# Patient Record
Sex: Male | Born: 1935 | Race: Black or African American | Hispanic: No | Marital: Married | Smoking: Former smoker
Health system: Southern US, Community
[De-identification: ages and names within clinical notes are randomized; demographics above are authoritative.]

## PROBLEM LIST (undated history)

## (undated) DIAGNOSIS — E039 Hypothyroidism, unspecified: Secondary | ICD-10-CM

## (undated) DIAGNOSIS — N183 Chronic kidney disease, stage 3 (moderate): Secondary | ICD-10-CM

## (undated) DIAGNOSIS — I69359 Hemiplegia and hemiparesis following cerebral infarction affecting unspecified side: Secondary | ICD-10-CM

## (undated) DIAGNOSIS — E785 Hyperlipidemia, unspecified: Secondary | ICD-10-CM

## (undated) DIAGNOSIS — I499 Cardiac arrhythmia, unspecified: Secondary | ICD-10-CM

## (undated) DIAGNOSIS — R001 Bradycardia, unspecified: Secondary | ICD-10-CM

## (undated) DIAGNOSIS — N200 Calculus of kidney: Secondary | ICD-10-CM

## (undated) DIAGNOSIS — I10 Essential (primary) hypertension: Secondary | ICD-10-CM

## (undated) DIAGNOSIS — M1711 Unilateral primary osteoarthritis, right knee: Secondary | ICD-10-CM

## (undated) DIAGNOSIS — I442 Atrioventricular block, complete: Secondary | ICD-10-CM

## (undated) DIAGNOSIS — Z950 Presence of cardiac pacemaker: Secondary | ICD-10-CM

## (undated) DIAGNOSIS — I453 Trifascicular block: Secondary | ICD-10-CM

## (undated) HISTORY — DX: Bradycardia, unspecified: R00.1

## (undated) HISTORY — DX: Essential (primary) hypertension: I10

## (undated) HISTORY — DX: Cardiac arrhythmia, unspecified: I49.9

## (undated) HISTORY — DX: Hyperlipidemia, unspecified: E78.5

## (undated) HISTORY — DX: Calculus of kidney: N20.0

## (undated) HISTORY — DX: Unilateral primary osteoarthritis, right knee: M17.11

## (undated) HISTORY — DX: Hemiplegia and hemiparesis following cerebral infarction affecting unspecified side: I69.359

## (undated) HISTORY — DX: Chronic kidney disease, stage 3 (moderate): N18.3

## (undated) HISTORY — DX: Trifascicular block: I45.3

## (undated) HISTORY — PX: OTHER SURGICAL HISTORY: SHX169

---
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
## 2008-03-28 ENCOUNTER — Emergency Department (HOSPITAL_COMMUNITY): Admission: EM | Admit: 2008-03-28 | Discharge: 2008-03-28 | Payer: Self-pay | Admitting: Emergency Medicine

## 2009-10-29 ENCOUNTER — Inpatient Hospital Stay (HOSPITAL_COMMUNITY): Admission: EM | Admit: 2009-10-29 | Discharge: 2009-10-31 | Payer: Self-pay | Admitting: Emergency Medicine

## 2009-10-29 ENCOUNTER — Ambulatory Visit: Payer: Self-pay | Admitting: Cardiology

## 2009-10-30 ENCOUNTER — Encounter (INDEPENDENT_AMBULATORY_CARE_PROVIDER_SITE_OTHER): Payer: Self-pay | Admitting: Internal Medicine

## 2011-01-03 LAB — CBC
HCT: 40 % (ref 39.0–52.0)
MCHC: 33.5 g/dL (ref 30.0–36.0)
RDW: 14.4 % (ref 11.5–15.5)
WBC: 6.2 10*3/uL (ref 4.0–10.5)

## 2011-01-03 LAB — CARDIAC PANEL(CRET KIN+CKTOT+MB+TROPI)
CK, MB: 1.4 ng/mL (ref 0.3–4.0)
Relative Index: INVALID (ref 0.0–2.5)
Relative Index: INVALID (ref 0.0–2.5)
Total CK: 66 U/L (ref 7–232)
Total CK: 71 U/L (ref 7–232)
Troponin I: 0.02 ng/mL (ref 0.00–0.06)
Troponin I: 0.03 ng/mL (ref 0.00–0.06)

## 2011-01-03 LAB — DIFFERENTIAL
Basophils Absolute: 0 10*3/uL (ref 0.0–0.1)
Basophils Relative: 0 % (ref 0–1)
Eosinophils Absolute: 0.1 10*3/uL (ref 0.0–0.7)
Eosinophils Relative: 3 % (ref 0–5)
Lymphs Abs: 1.1 10*3/uL (ref 0.7–4.0)
Monocytes Absolute: 0.3 10*3/uL (ref 0.1–1.0)
Monocytes Absolute: 0.4 10*3/uL (ref 0.1–1.0)
Monocytes Relative: 5 % (ref 3–12)
Monocytes Relative: 7 % (ref 3–12)
Neutrophils Relative %: 81 % — ABNORMAL HIGH (ref 43–77)

## 2011-01-03 LAB — BASIC METABOLIC PANEL
BUN: 17 mg/dL (ref 6–23)
CO2: 25 mEq/L (ref 19–32)
CO2: 25 mEq/L (ref 19–32)
CO2: 28 mEq/L (ref 19–32)
Calcium: 8.7 mg/dL (ref 8.4–10.5)
Calcium: 9.1 mg/dL (ref 8.4–10.5)
Chloride: 108 mEq/L (ref 96–112)
Chloride: 109 mEq/L (ref 96–112)
GFR calc Af Amer: 60 mL/min — ABNORMAL LOW (ref >60)
GFR calc Af Amer: >60 mL/min (ref >60)
GFR calc Af Amer: >60 mL/min (ref >60)
GFR calc non Af Amer: 49 mL/min — ABNORMAL LOW (ref >60)
Potassium: 3.8 mEq/L (ref 3.5–5.1)
Potassium: 3.8 mEq/L (ref 3.5–5.1)
Potassium: 3.9 mEq/L (ref 3.5–5.1)
Sodium: 141 mEq/L (ref 135–145)

## 2011-01-03 LAB — LIPID PANEL: VLDL: 15 mg/dL (ref 0–40)

## 2011-01-03 LAB — POCT CARDIAC MARKERS
CKMB, poc: 2 ng/mL (ref 1.0–8.0)
Troponin i, poc: <0.05 ng/mL (ref 0.00–0.09)

## 2011-01-03 LAB — TSH: TSH: 2.732 u[IU]/mL (ref 0.350–4.500)

## 2011-01-03 LAB — HEMOGLOBIN A1C
Hgb A1c MFr Bld: 5.7 % (ref 4.6–6.1)
Mean Plasma Glucose: 117 mg/dL

## 2011-07-15 LAB — DIFFERENTIAL
Basophils Absolute: 0
Monocytes Absolute: 0.3

## 2011-07-15 LAB — BASIC METABOLIC PANEL
Calcium: 8.7
Creatinine, Ser: 1.36
GFR calc Af Amer: >60
GFR calc non Af Amer: 52 — ABNORMAL LOW

## 2011-07-15 LAB — CBC
HCT: 39.8
MCHC: 35.1
Platelets: 164
RBC: 4.89

## 2012-02-25 ENCOUNTER — Encounter: Payer: Self-pay | Admitting: *Deleted

## 2014-02-28 ENCOUNTER — Other Ambulatory Visit: Payer: Self-pay | Admitting: Family Medicine

## 2014-02-28 ENCOUNTER — Other Ambulatory Visit: Payer: Self-pay

## 2014-02-28 DIAGNOSIS — R200 Anesthesia of skin: Secondary | ICD-10-CM

## 2014-03-04 ENCOUNTER — Ambulatory Visit
Admission: RE | Admit: 2014-03-04 | Discharge: 2014-03-04 | Disposition: A | Discharge status: Home / Self Care | Payer: Medicare Other | Source: Ambulatory Visit | Attending: Family Medicine | Admitting: Family Medicine

## 2014-03-04 DIAGNOSIS — R200 Anesthesia of skin: Secondary | ICD-10-CM

## 2014-04-01 ENCOUNTER — Encounter: Payer: Self-pay | Admitting: Interventional Cardiology

## 2014-04-01 ENCOUNTER — Ambulatory Visit (INDEPENDENT_AMBULATORY_CARE_PROVIDER_SITE_OTHER): Payer: Medicare Other | Admitting: Interventional Cardiology

## 2014-04-01 VITALS — BP 122/80 | HR 48 | Ht 70.0 in | Wt 177.0 lb

## 2014-04-01 DIAGNOSIS — Z01810 Encounter for preprocedural cardiovascular examination: Secondary | ICD-10-CM

## 2014-04-01 DIAGNOSIS — I69359 Hemiplegia and hemiparesis following cerebral infarction affecting unspecified side: Secondary | ICD-10-CM | POA: Insufficient documentation

## 2014-04-01 DIAGNOSIS — I69959 Hemiplegia and hemiparesis following unspecified cerebrovascular disease affecting unspecified side: Secondary | ICD-10-CM

## 2014-04-01 DIAGNOSIS — N183 Chronic kidney disease, stage 3 unspecified: Secondary | ICD-10-CM

## 2014-04-01 DIAGNOSIS — I10 Essential (primary) hypertension: Secondary | ICD-10-CM

## 2014-04-01 DIAGNOSIS — M1711 Unilateral primary osteoarthritis, right knee: Secondary | ICD-10-CM | POA: Insufficient documentation

## 2014-04-01 DIAGNOSIS — M1710 Unilateral primary osteoarthritis, unspecified knee: Secondary | ICD-10-CM

## 2014-04-01 DIAGNOSIS — E039 Hypothyroidism, unspecified: Secondary | ICD-10-CM

## 2014-04-01 DIAGNOSIS — G8190 Hemiplegia, unspecified affecting unspecified side: Secondary | ICD-10-CM | POA: Insufficient documentation

## 2014-04-01 DIAGNOSIS — IMO0002 Reserved for concepts with insufficient information to code with codable children: Secondary | ICD-10-CM

## 2014-04-01 HISTORY — DX: Hemiplegia and hemiparesis following cerebral infarction affecting unspecified side: I69.359

## 2014-04-01 HISTORY — DX: Unilateral primary osteoarthritis, right knee: M17.11

## 2014-04-01 HISTORY — DX: Chronic kidney disease, stage 3 unspecified: N18.30

## 2014-04-01 HISTORY — DX: Essential (primary) hypertension: I10

## 2014-04-01 NOTE — Progress Notes (Addendum)
   Date: 04/01/2014 ID: [name], DOB [birth year], MRN [id number] PCP: No primary provider on file.  Reason: Preoperative clearance for noncardiac surgery  ASSESSMENT;  1. Preoperative clearance for noncardiac surgery, pending results of the workup noted below 2. Trifascicular block with first-degree AV block, right bundle branch block, and left anterior hemiblock. The patient is asymptomatic but has limited exertional tolerance due to severe right knee arthritis 3. Hypertension 4. Osteoarthritis, right knee 5. Hypothyroidism  PLAN:  1. 24 hour Holter monitor to rule out excessive bradycardia do to high-grade AV block 2. 2-D Doppler echocardiogram to assess LV function 3. Clearance for surgery is pending results of testing   SUBJECTIVE: Steven Krueger is a 79 y.o. male who is asymptomatic from a cardiac standpoint. He is referred for cardiac clearance for noncardiac surgery because of an abnormal EKG. He denies chest pain, dyspnea, orthopnea, syncope, palpitations, edema, claudication, and lower extremity swelling.   Allergies not on file  Current Outpatient Prescriptions on File Prior to Visit  Medication Sig Dispense Refill  . aspirin 325 MG tablet Take 325 mg by mouth daily.      Marland Kitchen levothyroxine (SYNTHROID, LEVOTHROID) 25 MCG tablet Take 25 mcg by mouth daily.       No current facility-administered medications on file prior to visit.    Past Medical History  Diagnosis Date  . Hypertension   . Hyperlipidemia   . Thyroid disease   . Kidney stones   . H/O: CVA (cardiovascular accident)     Past Surgical History  Procedure Laterality Date  . Knee surgery      History   Social History  . Marital Status: Married    Spouse Name: N/A    Number of Children: 2  . Years of Education: N/A   Occupational History  . retired    Social History Main Topics  . Smoking status: Former Games developer  . Smokeless tobacco: Not on file  . Alcohol Use: No  . Drug Use: Not on file    . Sexual Activity: Not on file   Other Topics Concern  . Not on file   Social History Narrative  . No narrative on file    Family History  Problem Relation Age of Onset  . Heart disease    . Hypertension    . Cancer      ROS: Has no cardiovascular complaints. He is never had syncope. He used to smoke but quit greater than 10 years ago. He does not drink alcohol. He denies orthopnea, PND, palpitations, transient neurological symptoms, edema, and weight loss. He is greatly limited by right lower extremity knee arthritis. Other systems negative for complaints.  OBJECTIVE: BP 122/80  Pulse 48  Ht 5\' 10"  (1.778 m)  Wt 177 lb (80.287 kg)  BMI 25.40 kg/m2,  General: No acute distress, slender, walks with cane, effusion than stated age HEENT: normal without jaundice or pallor Neck: JVD flat. Carotids absent Chest: Clear Cardiac: Murmur: Absent. Gallop: S4 gallop. Rhythm: Normal. Other: Normal Abdomen: Bruit: Absent. Pulsation: Absent Extremities: Edema: Absent. Pulses: 2+ Neuro: Normal Psych: Normal  ECG: Sinus bradycardia, left anterior hemiblock, right bundle branch block, first grade AV block with PR interval of 422 ms

## 2014-04-01 NOTE — Patient Instructions (Signed)
Your physician recommends that you continue on your current medications as directed. Please refer to the Current Medication list given to you today.  Your physician has recommended that you wear a holter monitor. Holter monitors are medical devices that record the heart's electrical activity. Doctors most often use these monitors to diagnose arrhythmias. Arrhythmias are problems with the speed or rhythm of the heartbeat. The monitor is a small, portable device. You can wear one while you do your normal daily activities. This is usually used to diagnose what is causing palpitations/syncope (passing out).   Your physician has requested that you have an echocardiogram. Echocardiography is a painless test that uses sound waves to create images of your heart. It provides your doctor with information about the size and shape of your heart and how well your heart's chambers and valves are working. This procedure takes approximately one hour. There are no restrictions for this procedure.

## 2014-04-11 ENCOUNTER — Encounter: Payer: Self-pay | Admitting: *Deleted

## 2014-04-11 ENCOUNTER — Other Ambulatory Visit: Payer: Self-pay

## 2014-04-11 ENCOUNTER — Encounter (INDEPENDENT_AMBULATORY_CARE_PROVIDER_SITE_OTHER): Payer: Medicare Other

## 2014-04-11 DIAGNOSIS — R001 Bradycardia, unspecified: Secondary | ICD-10-CM

## 2014-04-11 DIAGNOSIS — I498 Other specified cardiac arrhythmias: Secondary | ICD-10-CM

## 2014-04-11 DIAGNOSIS — Z01810 Encounter for preprocedural cardiovascular examination: Secondary | ICD-10-CM

## 2014-04-11 NOTE — Progress Notes (Signed)
Patient ID: Steven Krueger, male   DOB: 08/01/1935, 79 y.o.   MRN: [id number] [facility] 24 hour holter monitor applied to patient. 

## 2014-04-11 NOTE — Progress Notes (Signed)
Patient ID: Steven Krueger, male   DOB: 1934/10/27, 79 y.o.   MRN: [id number] Technical difficulty with [facility] 24 hour holter monitor.  Monitor replaced with an EVO 24 hour holter monitor.

## 2014-04-23 ENCOUNTER — Ambulatory Visit (HOSPITAL_COMMUNITY): Payer: Medicare Other | Attending: Cardiology | Admitting: Cardiology

## 2014-04-23 DIAGNOSIS — I517 Cardiomegaly: Secondary | ICD-10-CM | POA: Insufficient documentation

## 2014-04-23 DIAGNOSIS — Z01810 Encounter for preprocedural cardiovascular examination: Secondary | ICD-10-CM | POA: Insufficient documentation

## 2014-04-23 DIAGNOSIS — I10 Essential (primary) hypertension: Secondary | ICD-10-CM | POA: Insufficient documentation

## 2014-04-23 DIAGNOSIS — E785 Hyperlipidemia, unspecified: Secondary | ICD-10-CM | POA: Insufficient documentation

## 2014-04-23 DIAGNOSIS — I359 Nonrheumatic aortic valve disorder, unspecified: Secondary | ICD-10-CM | POA: Insufficient documentation

## 2014-04-23 NOTE — Progress Notes (Signed)
Echo performed. 

## 2014-04-29 ENCOUNTER — Telehealth: Payer: Self-pay

## 2014-04-29 NOTE — Telephone Encounter (Signed)
pt aware of holter monitor results.NSR 1st degree AV block, Rare Ventricular and Supraventricular ectopy, No Afib, avg HR 59.pt verbalized understanding.

## 2014-05-22 ENCOUNTER — Telehealth: Payer: Self-pay | Admitting: Interventional Cardiology

## 2014-05-22 NOTE — Telephone Encounter (Signed)
New message     Has pt been cleared for rt total knee surgery?  Please fax to [phone].

## 2014-05-23 NOTE — Telephone Encounter (Signed)
Will route to Dr.Smith for cardiac clearnace

## 2014-05-28 NOTE — Telephone Encounter (Signed)
The patient is cleared for the upcoming orthopedic surgical procedure. The cardiac evaluation including an echocardiogram and Holter monitor did not reveal significant abnormality or high risk features.

## 2014-05-28 NOTE — Telephone Encounter (Signed)
Pt cleared by Dr.Smith for upcoming knee replacement sx. Cardiac clearance given to medical records to fax to [facility] fax # [phone]

## 2014-06-11 ENCOUNTER — Telehealth: Payer: Self-pay | Admitting: Interventional Cardiology

## 2014-06-11 NOTE — Telephone Encounter (Signed)
New message      Pt is having surgery by Dr Felipa Evener send clearance to his office.  Phone number is [phone].  Pt saw Dr Katrinka Blazing 04-23-14.

## 2014-06-13 NOTE — Telephone Encounter (Signed)
Cardiac clearance faxed to [phone] attn: Dr.Lucey

## 2014-06-14 ENCOUNTER — Other Ambulatory Visit: Payer: Self-pay | Admitting: Orthopedic Surgery

## 2014-06-25 ENCOUNTER — Encounter (HOSPITAL_COMMUNITY): Payer: Self-pay

## 2014-06-26 NOTE — Pre-Procedure Instructions (Signed)
ODEN HOLLENBACH  06/26/2014   Your procedure is scheduled on:  Mon, Sept 21 @ 11:15 AM  Report to [facility]  at 9:15 AM.  Call this number if you have problems the morning of surgery: [phone]   Remember:   Do not eat food or drink liquids after midnight.   Take these medicines the morning of surgery with A SIP OF WATER: Synthroid(Levothyroxine)              Stop taking your Aspirin. No Goody's,BC's,Aleve,Aspirin,Ibuprofen,Fish Oil,or any Herbal Medications   Do not wear jewelry  Do not wear lotions, powders, or colognes. You may wear deodorant.  Men may shave face and neck.  Do not bring valuables to the hospital.  [facility] is not responsible                  for any belongings or valuables.               Contacts, dentures or bridgework may not be worn into surgery.  Leave suitcase in the car. After surgery it may be brought to your room.  For patients admitted to the hospital, discharge time is determined by your                treatment team.                Special Instructions:  [place] - Preparing for Surgery  Before surgery, you can play an important role.  Because skin is not sterile, your skin needs to be as free of germs as possible.  You can reduce the number of germs on you skin by washing with CHG (chlorahexidine gluconate) soap before surgery.  CHG is an antiseptic cleaner which kills germs and bonds with the skin to continue killing germs even after washing.  Please DO NOT use if you have an allergy to CHG or antibacterial soaps.  If your skin becomes reddened/irritated stop using the CHG and inform your nurse when you arrive at Short Stay.  Do not shave (including legs and underarms) for at least 48 hours prior to the first CHG shower.  You may shave your face.  Please follow these instructions carefully:   1.  Shower with CHG Soap the night before surgery and the                                morning of Surgery.  2.  If you choose to wash your  hair, wash your hair first as usual with your       normal shampoo.  3.  After you shampoo, rinse your hair and body thoroughly to remove the                      Shampoo.  4.  Use CHG as you would any other liquid soap.  You can apply chg directly       to the skin and wash gently with scrungie or a clean washcloth.  5.  Apply the CHG Soap to your body ONLY FROM THE NECK DOWN.        Do not use on open wounds or open sores.  Avoid contact with your eyes,       ears, mouth and genitals (private parts).  Wash genitals (private parts)       with your normal soap.  6.  Wash thoroughly, paying  special attention to the area where your surgery        will be performed.  7.  Thoroughly rinse your body with warm water from the neck down.  8.  DO NOT shower/wash with your normal soap after using and rinsing off       the CHG Soap.  9.  Pat yourself dry with a clean towel.            10.  Wear clean pajamas.            11.  Place clean sheets on your bed the night of your first shower and do not        sleep with pets.  Day of Surgery  Do not apply any lotions/deoderants the morning of surgery.  Please wear clean clothes to the hospital/surgery center.     Please read over the following fact sheets that you were given: Pain Booklet, Coughing and Deep Breathing, Blood Transfusion Information, MRSA Information and Surgical Site Infection Prevention

## 2014-06-27 ENCOUNTER — Emergency Department (HOSPITAL_COMMUNITY)
Admission: EM | Admit: 2014-06-27 | Discharge: 2014-06-27 | Disposition: A | Discharge status: Home / Self Care | Payer: Medicare Other | Attending: Emergency Medicine | Admitting: Emergency Medicine

## 2014-06-27 ENCOUNTER — Encounter (HOSPITAL_COMMUNITY): Payer: Self-pay

## 2014-06-27 ENCOUNTER — Encounter (HOSPITAL_COMMUNITY)
Admission: RE | Admit: 2014-06-27 | Discharge: 2014-06-27 | Disposition: A | Discharge status: Home / Self Care | Payer: Medicare Other | Source: Ambulatory Visit | Attending: Orthopedic Surgery | Admitting: Orthopedic Surgery

## 2014-06-27 ENCOUNTER — Encounter (HOSPITAL_COMMUNITY): Payer: Self-pay | Admitting: Vascular Surgery

## 2014-06-27 ENCOUNTER — Encounter (HOSPITAL_COMMUNITY): Payer: Self-pay | Admitting: Emergency Medicine

## 2014-06-27 DIAGNOSIS — I498 Other specified cardiac arrhythmias: Secondary | ICD-10-CM | POA: Insufficient documentation

## 2014-06-27 DIAGNOSIS — Z87891 Personal history of nicotine dependence: Secondary | ICD-10-CM | POA: Diagnosis not present

## 2014-06-27 DIAGNOSIS — I10 Essential (primary) hypertension: Secondary | ICD-10-CM | POA: Insufficient documentation

## 2014-06-27 DIAGNOSIS — E039 Hypothyroidism, unspecified: Secondary | ICD-10-CM | POA: Diagnosis not present

## 2014-06-27 DIAGNOSIS — I499 Cardiac arrhythmia, unspecified: Secondary | ICD-10-CM

## 2014-06-27 DIAGNOSIS — Z8673 Personal history of transient ischemic attack (TIA), and cerebral infarction without residual deficits: Secondary | ICD-10-CM | POA: Insufficient documentation

## 2014-06-27 DIAGNOSIS — Z01810 Encounter for preprocedural cardiovascular examination: Secondary | ICD-10-CM | POA: Insufficient documentation

## 2014-06-27 DIAGNOSIS — Z87442 Personal history of urinary calculi: Secondary | ICD-10-CM | POA: Insufficient documentation

## 2014-06-27 DIAGNOSIS — M1710 Unilateral primary osteoarthritis, unspecified knee: Secondary | ICD-10-CM | POA: Diagnosis present

## 2014-06-27 DIAGNOSIS — Z7982 Long term (current) use of aspirin: Secondary | ICD-10-CM | POA: Insufficient documentation

## 2014-06-27 DIAGNOSIS — R001 Bradycardia, unspecified: Secondary | ICD-10-CM

## 2014-06-27 DIAGNOSIS — Z79899 Other long term (current) drug therapy: Secondary | ICD-10-CM | POA: Diagnosis not present

## 2014-06-27 HISTORY — DX: Bradycardia, unspecified: R00.1

## 2014-06-27 HISTORY — DX: Other specified cardiac arrhythmias: I49.8

## 2014-06-27 HISTORY — DX: Hypothyroidism, unspecified: E03.9

## 2014-06-27 LAB — CBC WITH DIFFERENTIAL/PLATELET
BASOS ABS: 0 10*3/uL (ref 0.0–0.1)
BASOS PCT: 0 % (ref 0–1)
Eosinophils Absolute: 0.2 10*3/uL (ref 0.0–0.7)
Eosinophils Relative: 3 % (ref 0–5)
HCT: 44.3 % (ref 39.0–52.0)
HEMOGLOBIN: 14.8 g/dL (ref 13.0–17.0)
Lymphocytes Relative: 24 % (ref 12–46)
Lymphs Abs: 1.4 10*3/uL (ref 0.7–4.0)
MCH: 28 pg (ref 26.0–34.0)
MCHC: 33.4 g/dL (ref 30.0–36.0)
MCV: 83.9 fL (ref 78.0–100.0)
Monocytes Absolute: 0.5 10*3/uL (ref 0.1–1.0)
Monocytes Relative: 9 % (ref 3–12)
Neutro Abs: 3.7 10*3/uL (ref 1.7–7.7)
Neutrophils Relative %: 64 % (ref 43–77)
Platelets: 195 10*3/uL (ref 150–400)
RBC: 5.28 MIL/uL (ref 4.22–5.81)
RDW: 13.8 % (ref 11.5–15.5)
WBC: 5.9 10*3/uL (ref 4.0–10.5)

## 2014-06-27 LAB — I-STAT CHEM 8, ED
BUN: 25 mg/dL — ABNORMAL HIGH (ref 6–23)
Calcium, Ion: 1.13 mmol/L (ref 1.13–1.30)
Chloride: 107 meq/L (ref 96–112)
Creatinine, Ser: 1.8 mg/dL — ABNORMAL HIGH (ref 0.50–1.35)
Glucose, Bld: 78 mg/dL (ref 70–99)
HCT: 51 % (ref 39.0–52.0)
Hemoglobin: 17.3 g/dL — ABNORMAL HIGH (ref 13.0–17.0)
Potassium: 4.1 meq/L (ref 3.7–5.3)
Sodium: 140 meq/L (ref 137–147)
TCO2: 25 mmol/L (ref 0–100)

## 2014-06-27 LAB — TSH: TSH: 2.4 u[IU]/mL (ref 0.350–4.500)

## 2014-06-27 LAB — I-STAT CG4 LACTIC ACID, ED: Lactic Acid, Venous: 1.86 mmol/L (ref 0.5–2.2)

## 2014-06-27 LAB — MAGNESIUM: Magnesium: 2.2 mg/dL (ref 1.5–2.5)

## 2014-06-27 MED ORDER — CHLORHEXIDINE GLUCONATE 4 % EX LIQD: 60.0000 mL | Freq: Once | CUTANEOUS | Status: DC

## 2014-06-27 NOTE — Progress Notes (Signed)
Anesthesia Chart Review:  Patient is a 79 year old male scheduled for right TKA on 07/08/14 by Dr. Sherlean Foot.  History includes former smoker, HLD, HTN, nephrolithiasis, hypothyroidism. Labs since 04/2013 indicate a degree of CKD (stage III by estimated GFR last year). PCP is Dr. Benedetto Goad, most recent records pending.    Cardiologist is Dr. Verdis Prime who saw patient for clearance on 04/01/14 due to abnormal EKG with trifascicular block and bradycardia. Echo and Holter monitor were ordered (see below).  Patient ultimately received cardiac clearance; however, I was alerted today by PAT RN Silva Bandy that our tech reported his HR was in the 20's.  Kristi manually rechecked his pulse and got 29 bpm.  BP was initially hard to get, but a reading of 120/58 was obtained. Patient reported feeling a little tired, but was otherwise asymptomatic of his significant bradycardia.  His HR was previously documented at 48 bpm at Dr. Michaelle Copas office with a minimum HR of 46 bpm on his recent Holter monitor.  Even though patient was relatively asymptomatic, I felt that due to a heart rate < 30 bpm, he should be transported to the ED for further evaluation.    EKG today showed SB, first degree AVB, bigeminy PVCs, LAD/LAFB, right BBB.  PVCs are new when compared to 03/2014 EKGs.  Echo on 04/23/14 showed: Normal biventricular size and function. LVEF 60-65%. Grade 1 diastolic dysfunction. Mild aortic regurgitation.  24 hour Holter monitor read in 04/2014 showed: SR/SB, first degree AVB, rare ventricular and supraventricular ectopy, no afib, average HR 59 bpm (range 46 - 90 bpm).  Carotid duplex on 10/30/09 showed: No evidence of hemodynamically significant stenosis.  Labs from today in the ED (CBC with diff, TSH, ISTAT8, Mg) noted.  BUN/Cr 25/1.80 (currently most recent comparison labs are from 05/02/13 under Media tab and show a BUN/Cr of 19/1.6.  By notes, his HR was back up to the 50's in the ED but they were at least going to touch  base with cardiology today due to his previous bradycardia and new bigeminy pattern.  He was ultimately discharged from the ED with out-patient appointment to see [name] for re-evaluation on 07/05/14 by 4PM. I've left a voice message for Keri at Dr. Tobin Chad office to call me for an update.    Velna Ochs [facility] Short Stay Center/Anesthesiology Phone [phone] 06/27/2014 1:11 PM

## 2014-06-27 NOTE — ED Notes (Signed)
Pt arrived from Short Stay. Preop appointment for R knee surgery next week. Short Stay noticed HR in 20's. Pt denies feeling faint or dizzy. No pain, HR currently 55. Pt a/o x4

## 2014-06-27 NOTE — Progress Notes (Signed)
   Echo reports in epic from 2011 and 2015  Medical Md is Dr.Fred Andrey Campanile

## 2014-06-27 NOTE — ED Provider Notes (Signed)
I saw and evaluated the patient, reviewed the resident's note and I agree with the findings and plan.   EKG Interpretation None      79yM sent for evaluation after pre-op noted to be bradycardic. Monitor likely inaccurately calculating actual rate. EKG with sinus brady, 1st degree av block, RBBB, LAFB with PVCs/bigeminy. Pt with known conduction disease. Aside from bigeminy, doesn't appear significantly changed from prior/recent EKGs. Only meds are synthroid, linsinopril/HCTZ, ASA. Pt is without symptoms. Denies CP, SOB, dizziness, lightheadedness, etc.  Nothing making me overly concerned at this point.Will check lytes, trop, TSH. Will touch base with cards though.   Raeford Razor, MD 06/27/14 1044

## 2014-06-27 NOTE — ED Notes (Signed)
Lab results given to Dr.Kohut. 

## 2014-06-27 NOTE — ED Provider Notes (Signed)
CSN: [id number]     Arrival date & time 06/27/14  1023 History   First MD Initiated Contact with Patient 06/27/14 1024     Chief Complaint  Patient presents with  . Bradycardia     (Consider location/radiation/quality/duration/timing/severity/associated sxs/prior Treatment) HPI DECAMERON HOOD 79 y.o. with a pmh of CVA, hypothyroidism, and HTN presents from the pre op clinic for concern of bradycardia. About 30 minutes prior to arrival he had a pre op ECG performed and was noted to be 29. He was sent here for further evaluation and management. He denies any symptoms at this point. Denies any lightheadedness, N/V, syncope, pre syncope, chest pain, SOB. No new changes to medications. No known exacerbating or relieving factors. He was having a pre operative assessment for a knee replacement.   Past Medical History  Diagnosis Date  . Hyperlipidemia   . Kidney stones   . Hypertension     takes Prinzide daily  . Hypothyroidism     takes Synthroid daily   Past Surgical History  Procedure Laterality Date  . Knee surgery     Family History  Problem Relation Age of Onset  . Heart disease    . Hypertension    . Cancer    . Cancer Father    History  Substance Use Topics  . Smoking status: Former Games developer  . Smokeless tobacco: Not on file  . Alcohol Use: No    Review of Systems  Constitutional: Negative for fever, diaphoresis, fatigue and unexpected weight change.  HENT: Negative for congestion, ear discharge and hearing loss.   Respiratory: Negative for shortness of breath, wheezing and stridor.   Cardiovascular: Negative for chest pain and leg swelling.  Neurological: Negative for dizziness, seizures, syncope, facial asymmetry, numbness and headaches.  All other systems reviewed and are negative.     Allergies  Review of patient's allergies indicates no known allergies.  Home Medications   Prior to Admission medications   Medication Sig Start Date End Date Taking? Authorizing  Provider  aspirin 325 MG tablet Take 325 mg by mouth daily.   Yes Historical Provider, MD  levothyroxine (SYNTHROID, LEVOTHROID) 25 MCG tablet Take 25 mcg by mouth daily.   Yes Historical Provider, MD  lisinopril-hydrochlorothiazide (PRINZIDE,ZESTORETIC) 20-12.5 MG per tablet Take 1 tablet by mouth daily.  03/12/14  Yes Historical Provider, MD   BP 128/76  Pulse 53  Temp(Src) 97.9 F (36.6 C) (Oral)  Resp 12  Ht 5\' 11"  (1.803 m)  Wt 177 lb (80.287 kg)  BMI 24.70 kg/m2  SpO2 98% Physical Exam  Nursing note and vitals reviewed. Constitutional: He is oriented to person, place, and time. He appears well-developed and well-nourished. No distress.  HENT:  Head: Normocephalic and atraumatic.  Eyes: Conjunctivae and EOM are normal. Right eye exhibits no discharge. Left eye exhibits no discharge.  Neck: Normal range of motion. Neck supple. No tracheal deviation present.  Cardiovascular: Normal heart sounds.  An irregular rhythm present. Bradycardia present.  Exam reveals no friction rub.   No murmur heard. Pulses:      Radial pulses are 2+ on the right side, and 2+ on the left side.       Dorsalis pedis pulses are 2+ on the right side, and 2+ on the left side.  Pulmonary/Chest: Effort normal and breath sounds normal. No stridor. No respiratory distress. He has no wheezes. He has no rales. He exhibits no tenderness.  Abdominal: Soft. He exhibits no distension. There is no tenderness. There  is no rebound and no guarding.  Neurological: He is alert and oriented to person, place, and time.  Skin: Skin is warm.  Psychiatric: He has a normal mood and affect.    ED Course  Procedures (including critical care time) Labs Review Labs Reviewed  I-STAT CHEM 8, ED - Abnormal; Notable for the following:    BUN 25 (*)    Creatinine, Ser 1.80 (*)    Hemoglobin 17.3 (*)    All other components within normal limits  CBC WITH DIFFERENTIAL  TSH  MAGNESIUM  I-STAT CG4 LACTIC ACID, ED    Imaging  Review No results found.   EKG Interpretation None      MDM   Final diagnoses:  Bradycardia  Bigeminy   Pt presents from pre op clinic for concern of bradycardia. Asymptomatic. Patient is very active and has not had any symptoms with exertion or activity. Patient has a sinus rhythm with bigeminy. HR approximately in the mid 40's with bigeminy. Spoke to Dr. Myrtis Ser with cardiology who rec electrolyte work up. No etiology of arrhythmia identified. Electrolytes wnls. TSH wnls. No further work up indicated as the patient is asymptomatic. WIll outpatient cardiology work up. Gave the contact information of Dr. Myrtis Ser. Gave strong return precautions including worsening symptoms or any other alarming or concerning symptoms or issues. Strong return precautions given for worsening symptoms or any other alarming or concerning symptoms or issues. The patient was in agreement with the treatment plan and I answered all of their questions. The patient was stable for dc. At dc, the patient ambulated without difficulty, was moving all four extremities, symptoms improved, NAD. and AOx4 Care discussed with my attending, Dr. Juleen China. If performed and available, imaging studies and labs reviewed.     Sena Hitch, MD 06/27/14 [phone]

## 2014-07-01 ENCOUNTER — Encounter: Payer: Self-pay | Admitting: Interventional Cardiology

## 2014-07-01 ENCOUNTER — Encounter: Payer: Self-pay | Admitting: *Deleted

## 2014-07-01 ENCOUNTER — Encounter (INDEPENDENT_AMBULATORY_CARE_PROVIDER_SITE_OTHER): Payer: Medicare Other

## 2014-07-01 ENCOUNTER — Ambulatory Visit (INDEPENDENT_AMBULATORY_CARE_PROVIDER_SITE_OTHER): Payer: Medicare Other | Admitting: Interventional Cardiology

## 2014-07-01 VITALS — BP 139/75 | HR 58 | Ht 71.0 in | Wt 181.0 lb

## 2014-07-01 DIAGNOSIS — N183 Chronic kidney disease, stage 3 unspecified: Secondary | ICD-10-CM

## 2014-07-01 DIAGNOSIS — I499 Cardiac arrhythmia, unspecified: Principal | ICD-10-CM

## 2014-07-01 DIAGNOSIS — I498 Other specified cardiac arrhythmias: Secondary | ICD-10-CM

## 2014-07-01 DIAGNOSIS — I10 Essential (primary) hypertension: Secondary | ICD-10-CM

## 2014-07-01 DIAGNOSIS — I69359 Hemiplegia and hemiparesis following cerebral infarction affecting unspecified side: Secondary | ICD-10-CM

## 2014-07-01 DIAGNOSIS — I69959 Hemiplegia and hemiparesis following unspecified cerebrovascular disease affecting unspecified side: Secondary | ICD-10-CM

## 2014-07-01 DIAGNOSIS — I4949 Other premature depolarization: Secondary | ICD-10-CM

## 2014-07-01 DIAGNOSIS — I453 Trifascicular block: Secondary | ICD-10-CM

## 2014-07-01 DIAGNOSIS — Z01810 Encounter for preprocedural cardiovascular examination: Secondary | ICD-10-CM

## 2014-07-01 HISTORY — DX: Trifascicular block: I45.3

## 2014-07-01 NOTE — Patient Instructions (Signed)
Your physician recommends that you continue on your current medications as directed. Please refer to the Current Medication list given to you today.  Your physician has recommended that you wear a holter monitor. Holter monitors are medical devices that record the heart's electrical activity. Doctors most often use these monitors to diagnose arrhythmias. Arrhythmias are problems with the speed or rhythm of the heartbeat. The monitor is a small, portable device. You can wear one while you do your normal daily activities. This is usually used to diagnose what is causing palpitations/syncope (passing out). ( To be placed today @3 :30pk ok per Burnett Harry)

## 2014-07-01 NOTE — Progress Notes (Signed)
Patient ID: Steven Krueger, male   DOB: 07/06/1935, 79 y.o.   MRN: [id number] [facility] 24 hour holter monitor applied to patient.

## 2014-07-01 NOTE — Progress Notes (Addendum)
Patient ID: Steven Krueger, male   DOB: [birth year], 79 y.o.   MRN: [id number]    [address] [place], [place]  [address] Phone: [phone] Fax:  [phone]  Date:  07/01/2014   ID:  Steven Krueger, DOB 07/18/1935, MRN [id number]  PCP:  Pamelia Hoit, MD   ASSESSMENT:  1. Pseudo-bradycardia noted when peripheral pulse measured, but EKG documents ventricular bigeminy. 2. Hypertension, under control 3. History of CVA 4. Preoperative cardiovascular evaluation 5. History of trifascicular block  PLAN:  1. 24 hour Holter monitor 2. If excessive bradycardia/high-grade AV block is not noted on Holter monitor, will likely clear the patient to have orthopedic surgery. 3. Clearance for surgery is pending results of Holter monitor   SUBJECTIVE: Steven Krueger is a 79 y.o. male is a 79 year old gentleman attempting to have knee replacement surgery. He was being evaluated preoperatively and had surgery canceled because of bradycardia documented by measuring the peripheral pulses. He was sent emergently to the ER where he was found to be in ventricular bigeminy and was totally asymptomatic. He had previously undergone evaluation in this office in June because of trifascicular block on EKG. He has never had a cardiopulmonary complaints. No episodes of syncope, palpitations, edema, orthopnea, or dyspnea. He is here today to attempt to achieve clearance for noncardiac surgery.   Wt Readings from Last 3 Encounters:  07/01/14 181 lb (82.101 kg)  06/27/14 177 lb (80.287 kg)  06/27/14 179 lb 11.2 oz (81.511 kg)     Past Medical History  Diagnosis Date  . Hyperlipidemia   . Kidney stones   . Hypertension     takes Prinzide daily  . Hypothyroidism     takes Synthroid daily  . Ventricular bigeminy 06/27/2014  . 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 Chronic kidney disease, stage 3 04/01/2014  . 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   Right bundle, left anterior hemiblock, and first degree AV block, June, 2015     Current Outpatient Prescriptions  Medication Sig Dispense Refill  . aspirin 325 MG tablet Take 325 mg by mouth daily.      Marland Kitchen levothyroxine (SYNTHROID, LEVOTHROID) 25 MCG tablet Take 25 mcg by mouth daily.      Marland Kitchen lisinopril-hydrochlorothiazide (PRINZIDE,ZESTORETIC) 20-12.5 MG per tablet Take 1 tablet by mouth daily.        No current facility-administered medications for this visit.    Allergies:   No Known Allergies  Social History:  The patient  reports that he has quit smoking. He does not have any smokeless tobacco history on file. He reports that he does not drink alcohol.   ROS:  Please see the history of present illness.   Denies claudication, new stroke symptoms, palpitations, lower extremity edema, syncope, and chest pain. No symptoms of dyspnea.   All other systems reviewed and negative.   OBJECTIVE: VS:  BP 139/75  Pulse 58  Ht  (1.803 m)  Wt 181 lb (82.101 kg)  BMI 25.26 kg/m2 Well nourished, well developed, in no acute distress, elderly, slender, and slightly younger appearing than age HEENT: normal Neck: JVD flat. Carotid bruit bradycardia  Cardiac:  normal S1, S2; RRR; no murmur Lungs:  clear to auscultation bilaterally, no wheezing, rhonchi or rales Abd: soft, nontender, no hepatomegaly Ext: Edema none. Pulses bradycardic Skin: warm and dry Neuro:  CNs 2-12 intact, no focal abnormalities noted  EKG:  Reviewed EKG from the emergency department  revealing ventricular bigeminy       Signed, Darci Needle III, MD 07/01/2014 2:51 PM

## 2014-07-05 ENCOUNTER — Encounter: Payer: Medicare Other | Admitting: Interventional Cardiology

## 2014-07-05 ENCOUNTER — Telehealth: Payer: Self-pay | Admitting: Interventional Cardiology

## 2014-07-05 NOTE — Telephone Encounter (Signed)
New message          Lupita Leash @ [facility] has sent via email results from pt / pt has had ventricular runs and PVC's and would like Dr Katrinka Blazing to look at the report

## 2014-07-08 ENCOUNTER — Encounter (HOSPITAL_COMMUNITY): Admission: RE | Payer: Self-pay | Source: Ambulatory Visit

## 2014-07-08 ENCOUNTER — Inpatient Hospital Stay (HOSPITAL_COMMUNITY): Admission: RE | Admit: 2014-07-08 | Payer: Medicare Other | Source: Ambulatory Visit | Admitting: Orthopedic Surgery

## 2014-07-08 SURGERY — ARTHROPLASTY, KNEE, TOTAL
Anesthesia: Regional | Site: Knee | Laterality: Right

## 2014-07-24 ENCOUNTER — Telehealth: Payer: Self-pay

## 2014-07-30 ENCOUNTER — Telehealth: Payer: Self-pay | Admitting: Interventional Cardiology

## 2014-07-30 NOTE — Telephone Encounter (Signed)
Already addressed

## 2014-07-30 NOTE — Telephone Encounter (Signed)
New message     Did Dr Katrinka Blazing clear this pt for surgery with Dr Valentina Gu.  He is scheduled to see them tomorrow to be cleared for surgery but they think he has already been cleared by Dr Katrinka Blazing.  Please call

## 2014-07-30 NOTE — Telephone Encounter (Signed)
pt pcp and pt wife aware of holter monitor results. -PVC are 18% of heart beats -No High Grade AVB -Overall OK pt wife verbalized understanding. pt wife aware that Dr.Smith is back in the office on 10/20.pt cardiac clearance was pending his holter monitors.  Will call pt to update him if Dr.Smith has cleared him for RTK Arthroplasty. Pt wife verbalized understanding.

## 2014-08-13 NOTE — Telephone Encounter (Signed)
pt wife aware Dr.Smith has cleared pt for right knee sx.

## 2014-08-13 NOTE — Telephone Encounter (Signed)
Dr.Smith is pt cleared for surgery with Dr.Lucey. for a right Arthroplasty

## 2014-08-16 NOTE — Telephone Encounter (Signed)
Where is the 24 hour holter performed on Sept 14? Can't find result and clearance is pending  That. I cleared him for surgery in August to Dr. Sherlean Foot but he bounced back due to bradycardia. Can't clear untiI get result of most recent Holter.

## 2014-08-19 NOTE — Telephone Encounter (Signed)
The patient is cleared for his upcoming surgical procedure with Dr. Sherlean Foot for arthroplasty. If there are further questions do not hesitate to call or write.

## 2015-04-22 ENCOUNTER — Encounter (HOSPITAL_COMMUNITY): Payer: Self-pay | Admitting: Emergency Medicine

## 2015-04-22 ENCOUNTER — Emergency Department (HOSPITAL_COMMUNITY): Payer: Medicare Other

## 2015-04-22 ENCOUNTER — Emergency Department (HOSPITAL_COMMUNITY)
Admission: EM | Admit: 2015-04-22 | Discharge: 2015-04-22 | Disposition: A | Discharge status: Home / Self Care | Payer: Medicare Other | Attending: Emergency Medicine | Admitting: Emergency Medicine

## 2015-04-22 DIAGNOSIS — Z87442 Personal history of urinary calculi: Secondary | ICD-10-CM | POA: Insufficient documentation

## 2015-04-22 DIAGNOSIS — Z7982 Long term (current) use of aspirin: Secondary | ICD-10-CM | POA: Insufficient documentation

## 2015-04-22 DIAGNOSIS — M25472 Effusion, left ankle: Secondary | ICD-10-CM | POA: Diagnosis not present

## 2015-04-22 DIAGNOSIS — N183 Chronic kidney disease, stage 3 (moderate): Secondary | ICD-10-CM | POA: Insufficient documentation

## 2015-04-22 DIAGNOSIS — E039 Hypothyroidism, unspecified: Secondary | ICD-10-CM | POA: Diagnosis not present

## 2015-04-22 DIAGNOSIS — Z87891 Personal history of nicotine dependence: Secondary | ICD-10-CM | POA: Diagnosis not present

## 2015-04-22 DIAGNOSIS — Z79899 Other long term (current) drug therapy: Secondary | ICD-10-CM | POA: Diagnosis not present

## 2015-04-22 DIAGNOSIS — Z8673 Personal history of transient ischemic attack (TIA), and cerebral infarction without residual deficits: Secondary | ICD-10-CM | POA: Diagnosis not present

## 2015-04-22 DIAGNOSIS — I129 Hypertensive chronic kidney disease with stage 1 through stage 4 chronic kidney disease, or unspecified chronic kidney disease: Secondary | ICD-10-CM | POA: Diagnosis not present

## 2015-04-22 DIAGNOSIS — M7989 Other specified soft tissue disorders: Secondary | ICD-10-CM | POA: Diagnosis present

## 2015-04-22 MED ORDER — NAPROXEN 500 MG PO TABS: 500.0000 mg | ORAL_TABLET | Freq: Two times a day (BID) | ORAL | Status: DC

## 2015-04-22 MED ORDER — PREDNISONE 50 MG PO TABS: ORAL_TABLET | ORAL | Status: DC

## 2015-04-22 MED ORDER — NAPROXEN 250 MG PO TABS
500.0000 mg | ORAL_TABLET | Freq: Once | ORAL | Status: AC
  Administered 2015-04-22: 500 mg via ORAL
  Filled 2015-04-22: qty 2

## 2015-04-22 MED ORDER — PREDNISONE 10 MG PO TABS
60.0000 mg | ORAL_TABLET | Freq: Once | ORAL | Status: AC
  Administered 2015-04-22: 60 mg via ORAL
  Filled 2015-04-22 (×2): qty 1

## 2015-04-22 NOTE — ED Notes (Signed)
PT c/o left foot pain and swelling x1 day with no reported injury. PT denies any SOB or CP.

## 2015-04-22 NOTE — Discharge Instructions (Signed)
X-rays showed no fracture. Prescription for prednisone and anti-inflammatory medicine. Elevate foot, ankle brace, crutches. Follow-up with orthopedic doctor if not getting better.

## 2015-04-22 NOTE — ED Notes (Signed)
Called family, son michael to notify that pt needs to take tylenol per Dr. Adriana Simas for slight fever.  Pt family member verbalized understanding of instruction.

## 2015-04-22 NOTE — ED Notes (Signed)
Pt made aware to return if symptoms worsen or if any life threatening symptoms occur.   

## 2015-04-22 NOTE — ED Provider Notes (Signed)
CSN: [id number]     Arrival date & time 04/22/15  1413 History   First MD Initiated Contact with Patient 04/22/15 1606     Chief Complaint  Patient presents with  . Foot Swelling     (Consider location/radiation/quality/duration/timing/severity/associated sxs/prior Treatment) HPI.... Left ankle swelling for 1 day. No obvious trauma or injury. This has never happened before. Severity is moderate. Positioning and palpation make pain worse. No other constitutional symptoms.  Past Medical History  Diagnosis Date  . Hyperlipidemia   . Kidney stones   . Hypertension     takes Prinzide daily  . Hypothyroidism     takes Synthroid daily  . Ventricular bigeminy 06/27/2014  . 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 Chronic kidney disease, stage 3 04/01/2014  . 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 No pertinent past surgical history. Family History  Problem Relation Age of Onset  . Heart disease    . Hypertension    . Cancer    . Cancer Father    History  Substance Use Topics  . Smoking status: Former Games developer  . Smokeless tobacco: Current User    Types: Chew  . Alcohol Use: No    Review of Systems  All other systems reviewed and are negative.     Allergies  Review of patient's allergies indicates no known allergies.  Home Medications   Prior to Admission medications   Medication Sig Start Date End Date Taking? Authorizing Provider  aspirin 325 MG tablet Take 325 mg by mouth daily.    Historical Provider, MD  levothyroxine (SYNTHROID, LEVOTHROID) 25 MCG tablet Take 25 mcg by mouth daily.    Historical Provider, MD  lisinopril-hydrochlorothiazide (PRINZIDE,ZESTORETIC) 20-12.5 MG per tablet Take 1 tablet by mouth daily.  03/12/14   Historical Provider, MD  naproxen (NAPROSYN) 500 MG tablet Take 1 tablet (500 mg total) by mouth 2 (two)  times daily. 04/22/15   Donnetta Hutching, MD  predniSONE (DELTASONE) 50 MG tablet 1 tablet for 5 days, one half tablet for 5 days 04/22/15   Donnetta Hutching, MD   BP 138/66 mmHg  Pulse 60  Temp(Src) 99 F (37.2 C) (Oral)  Resp 18  Ht  (1.778 m)  Wt 185 lb (83.915 kg)  BMI 26.54 kg/m2  SpO2 97% Physical Exam  Constitutional: He is oriented to person, place, and time. He appears well-developed and well-nourished.  HENT:  Head: Normocephalic and atraumatic.  Eyes: Conjunctivae and EOM are normal. Pupils are equal, round, and reactive to light.  Neck: Normal range of motion. Neck supple.  Cardiovascular: Normal rate and regular rhythm.   Pulmonary/Chest: Effort normal and breath sounds normal.  Abdominal: Soft. Bowel sounds are normal.  Musculoskeletal:  Left lower extremity: Tenderness and swelling in left ankle. Pain with range of motion.  Neurological: He is alert and oriented to person, place, and time.  Skin: Skin is warm and dry.  Psychiatric: He has a normal mood and affect. His behavior is normal.  Nursing note and vitals reviewed.   ED Course  Procedures (including critical care time) Labs Review Labs Reviewed - No data to display  Imaging Review Dg Ankle Complete Left  04/22/2015   CLINICAL DATA:  Lateral left ankle pain and swelling beginning 04/21/2015. No known injury. Initial encounter.  EXAM: LEFT ANKLE COMPLETE - 3+ VIEW  COMPARISON:  None.  FINDINGS: No acute bony or joint abnormality is identified. Exostosis off the dorsal margin of the talar neck is noted. There is some tibiotalar degenerative change. No joint effusion is noted. Atherosclerotic vascular disease identified.  IMPRESSION: No acute abnormality.   Electronically Signed   By: Drusilla Kanner M.D.   On: 04/22/2015 14:51     EKG Interpretation None      MDM   Final diagnoses:  Left ankle swelling    I suspect gouty arthritis. Rx for prednisone and Naprosyn. Ankle brace, crutches. Follow-up with  orthopedics    Donnetta Hutching, MD 04/22/15 [phone]

## 2015-11-14 DIAGNOSIS — J309 Allergic rhinitis, unspecified: Secondary | ICD-10-CM | POA: Insufficient documentation

## 2015-11-19 NOTE — Progress Notes (Signed)
Cardiology Office Note:    Date:  11/20/2015   ID:  Steven Krueger, DOB 09-Feb-1935, MRN [id number]  PCP:  Pamelia Hoit, MD  Cardiologist:  Dr. Verdis Prime   Electrophysiologist:  [facility] - Dr. Darrick Penna   Chief Complaint  Patient presents with  . PVCs    History of Present Illness:     Steven Krueger is a 81 y.o. male with a hx of trifascicular block, ventricular bigeminy, HL, HTN, CKD, prior CVA. The patient was seen by [name] in 9/15 for surgical clearance and evaluation of bradycardia. This was felt to be pseudo-bradycardia in the setting of ventricular bigeminy. Echo in 7/15 demonstrated normal LV function and mild diastolic dysfunction. Holter monitor in 9/15 demonstrated NSR and PVCs. PVCs were 18% total beats.  There was no high grade heart block.  He was cleared for surgery.    He recently saw primary care was noted to be bradycardic. He was sent back for earlier follow-up. Labs at PCPs office: Potassium 4.1, creatinine 1.57, ALT 12, LDL 108, TSH 1.906. The patient denies palpitations. He denies syncope, near-syncope. He denies any decreased exercise tolerance. He remains fairly active. He denies chest pain, shortness of breath, orthopnea, PND. He never got his knee surgery. He has some mild pedal edema.   Past Medical History  Diagnosis Date  . Hyperlipidemia   . Kidney stones   . Hypertension     takes Prinzide daily  . Hypothyroidism     takes Synthroid daily  . Ventricular bigeminy 06/27/2014  . 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 Chronic kidney disease, stage 3 04/01/2014  . CVA, old, hemiparesis (HCC) 04/01/2014  . 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  Current Medications: Outpatient Prescriptions Prior to Visit  Medication Sig Dispense Refill  . aspirin 325 MG tablet Take 325 mg by mouth daily.    Marland Kitchen levothyroxine  (SYNTHROID, LEVOTHROID) 25 MCG tablet Take 25 mcg by mouth daily.    Marland Kitchen lisinopril-hydrochlorothiazide (PRINZIDE,ZESTORETIC) 20-12.5 MG per tablet Take 1 tablet by mouth daily.     . naproxen (NAPROSYN) 500 MG tablet Take 1 tablet (500 mg total) by mouth 2 (two) times daily. (Patient not taking: Reported on 11/20/2015) 20 tablet 0  . predniSONE (DELTASONE) 50 MG tablet 1 tablet for 5 days, one half tablet for 5 days (Patient not taking: Reported on 11/20/2015) 8 tablet 0   No facility-administered medications prior to visit.     Allergies:   Review of patient's allergies indicates no known allergies.   Social History   Social History  . Marital Status: Married    Spouse Name: N/A  . Number of Children: 2  . Years of Education: N/A   Occupational History  . retired    Social History Main Topics  . Smoking status: Former Games developer  . Smokeless tobacco: Current User    Types: Chew  . Alcohol Use: No  . Drug Use: No  . Sexual Activity: Not Asked   Other Topics Concern  . None   Social History Narrative     Family History:  The patient's family history includes Cancer in his father.   ROS:   Please see the history of present illness.    Review of Systems  Cardiovascular: Positive for irregular heartbeat and leg swelling.  Respiratory: Positive for cough.  Musculoskeletal: Positive for back pain, joint swelling and myalgias.  Neurological: Positive for loss of balance.  All other systems reviewed and are negative.   Physical Exam:    VS:  BP 140/80 mmHg  Pulse 42  Ht 5\' 6"  (1.676 m)  Wt 184 lb 12.8 oz (83.825 kg)  BMI 29.84 kg/m2   GEN: Well nourished, well developed, in no acute distress HEENT: normal Neck: no JVD, no masses Cardiac: Normal S1/S2, irregular rhythm; no murmurs, trace bilateral ankle edema  Respiratory:  clear to auscultation bilaterally; no wheezing, rhonchi or rales GI: soft, nontender,  Neuro:  no focal deficits  Psych: Alert and oriented x 3, normal  affect  Wt Readings from Last 3 Encounters:  11/20/15 184 lb 12.8 oz (83.825 kg)  04/22/15 185 lb (83.915 kg)  07/01/14 181 lb (82.101 kg)      Studies/Labs Reviewed:     EKG:  EKG is  ordered today.  The ekg ordered today demonstrates sinus rhythm, ventricular rate 64, first-degree AV block with a PR interval of 320 ms, LAFB, RBBB  Recent Labs: No results found for requested labs within last 365 days.   Recent Lipid Panel    Component Value Date/Time   CHOL  10/30/2009 0604    143        ATP III CLASSIFICATION:  <200     mg/dL   Desirable  161-096  mg/dL   Borderline High  >=045    mg/dL   High          TRIG 73 10/30/2009 0604   HDL 24* 10/30/2009 0604   CHOLHDL 6.0 10/30/2009 0604   VLDL 15 10/30/2009 0604   LDLCALC * 10/30/2009 0604    104        Total Cholesterol/HDL:CHD Risk Coronary Heart Disease Risk Table                     Men   Women  1/2 Average Risk   3.4   3.3  Average Risk       5.0   4.4  2 X Average Risk   9.6   7.1  3 X Average Risk  23.4   11.0        Use the calculated Patient Ratio above and the CHD Risk Table to determine the patient's CHD Risk.        ATP III CLASSIFICATION (LDL):  <100     mg/dL   Optimal  409-811  mg/dL   Near or Above                    Optimal  130-159  mg/dL   Borderline  914-782  mg/dL   High  >956     mg/dL   Very High    Additional studies/ records that were reviewed today include:    Holter 07/01/14  NSR, PVCs 18%  Echo 04/23/14 Mild LVH, EF 60-65%, normal wall motion, grade 1 diastolic dysfunction, mild AI, mild LAE  Carotid US 1/11 No hemodynamic significant stenosis   ASSESSMENT:     1. Trifascicular block   2. Essential hypertension   3. Chronic kidney disease, stage 3     PLAN:     In order of problems listed above:  1. Trifascicular block - This is a chronic problem. He had a Holter in 2015 that demonstrated frequent PVCs and no high-grade heart block. Echo at that time demonstrated normal  LV function. He is not  symptomatic. He is somewhat limited by his knee pain. I will obtain a repeat Holter 48 hours to again screen for any high-grade heart block. We discussed the warning signs of significant bradycardia and when to follow-up. He knows to go to the emergency room if he should have syncope. Otherwise follow-up with [name] in 1 year.  2. HTN - Controlled.  3. CKD - He follows with nephrology. Recent creatinine stable.    Medication Adjustments/Labs and Tests Ordered: Current medicines are reviewed at length with the patient today.  Concerns regarding medicines are outlined above.  Medication changes, Labs and Tests ordered today are outlined in the Patient Instructions noted below. Patient Instructions  Medication Instructions:  Your physician recommends that you continue on your current medications as directed. Please refer to the Current Medication list given to you today.  Labwork: NONE  Testing/Procedures: Your physician has recommended that you wear a 48 HOUR holter monitor. Holter monitors are medical devices that record the heart's electrical activity. Doctors most often use these monitors to diagnose arrhythmias. Arrhythmias are problems with the speed or rhythm of the heartbeat. The monitor is a small, portable device. You can wear one while you do your normal daily activities. This is usually used to diagnose what is causing palpitations/syncope (passing out).  Follow-Up: Your physician wants you to follow-up in: 1 YEAR WITH DR. Marlou Starks will receive a reminder letter in the mail two months in advance. If you don't receive a letter, please call our office to schedule the follow-up appointment.  Any Other Special Instructions Will Be Listed Below (If Applicable).  If you need a refill on your cardiac medications before your next appointment, please call your pharmacy.     Signed, Tereso Newcomer, PA-C  11/20/2015 9:34 AM    [facility]  [facility] [address] [place], [place], [place]  [address] Phone: [phone]; Fax: [phone]

## 2015-11-20 ENCOUNTER — Encounter: Payer: Self-pay | Admitting: Physician Assistant

## 2015-11-20 ENCOUNTER — Ambulatory Visit (INDEPENDENT_AMBULATORY_CARE_PROVIDER_SITE_OTHER): Payer: Medicare Other | Admitting: Physician Assistant

## 2015-11-20 VITALS — BP 140/80 | HR 42 | Ht 66.0 in | Wt 184.8 lb

## 2015-11-20 DIAGNOSIS — I453 Trifascicular block: Secondary | ICD-10-CM

## 2015-11-20 DIAGNOSIS — N183 Chronic kidney disease, stage 3 unspecified: Secondary | ICD-10-CM

## 2015-11-20 DIAGNOSIS — I10 Essential (primary) hypertension: Secondary | ICD-10-CM | POA: Diagnosis not present

## 2015-11-20 NOTE — Patient Instructions (Addendum)
Medication Instructions:  Your physician recommends that you continue on your current medications as directed. Please refer to the Current Medication list given to you today.  Labwork: NONE  Testing/Procedures: Your physician has recommended that you wear a 48 HOUR holter monitor. Holter monitors are medical devices that record the heart's electrical activity. Doctors most often use these monitors to diagnose arrhythmias. Arrhythmias are problems with the speed or rhythm of the heartbeat. The monitor is a small, portable device. You can wear one while you do your normal daily activities. This is usually used to diagnose what is causing palpitations/syncope (passing out).  Follow-Up: Your physician wants you to follow-up in: 1 YEAR WITH DR. Marlou Starks will receive a reminder letter in the mail two months in advance. If you don't receive a letter, please call our office to schedule the follow-up appointment.  Any Other Special Instructions Will Be Listed Below (If Applicable).  If you need a refill on your cardiac medications before your next appointment, please call your pharmacy.

## 2015-11-25 ENCOUNTER — Ambulatory Visit (INDEPENDENT_AMBULATORY_CARE_PROVIDER_SITE_OTHER): Payer: Medicare Other

## 2015-11-25 ENCOUNTER — Other Ambulatory Visit: Payer: Self-pay | Admitting: Physician Assistant

## 2015-11-25 DIAGNOSIS — I493 Ventricular premature depolarization: Secondary | ICD-10-CM | POA: Diagnosis not present

## 2015-11-25 DIAGNOSIS — I453 Trifascicular block: Secondary | ICD-10-CM

## 2015-12-19 DIAGNOSIS — R200 Anesthesia of skin: Secondary | ICD-10-CM | POA: Insufficient documentation

## 2015-12-19 DIAGNOSIS — R001 Bradycardia, unspecified: Secondary | ICD-10-CM | POA: Insufficient documentation

## 2015-12-19 DIAGNOSIS — M25569 Pain in unspecified knee: Secondary | ICD-10-CM | POA: Insufficient documentation

## 2015-12-19 DIAGNOSIS — M179 Osteoarthritis of knee, unspecified: Secondary | ICD-10-CM | POA: Insufficient documentation

## 2015-12-19 DIAGNOSIS — E785 Hyperlipidemia, unspecified: Secondary | ICD-10-CM | POA: Insufficient documentation

## 2015-12-19 DIAGNOSIS — R5383 Other fatigue: Secondary | ICD-10-CM

## 2015-12-19 DIAGNOSIS — R631 Polydipsia: Secondary | ICD-10-CM | POA: Insufficient documentation

## 2015-12-19 DIAGNOSIS — S4990XA Unspecified injury of shoulder and upper arm, unspecified arm, initial encounter: Secondary | ICD-10-CM | POA: Insufficient documentation

## 2015-12-19 DIAGNOSIS — I252 Old myocardial infarction: Secondary | ICD-10-CM | POA: Insufficient documentation

## 2015-12-19 DIAGNOSIS — M1710 Unilateral primary osteoarthritis, unspecified knee: Secondary | ICD-10-CM | POA: Insufficient documentation

## 2015-12-19 DIAGNOSIS — I659 Occlusion and stenosis of unspecified precerebral artery: Secondary | ICD-10-CM | POA: Insufficient documentation

## 2015-12-19 DIAGNOSIS — R202 Paresthesia of skin: Secondary | ICD-10-CM

## 2015-12-19 DIAGNOSIS — R209 Unspecified disturbances of skin sensation: Secondary | ICD-10-CM | POA: Insufficient documentation

## 2015-12-19 DIAGNOSIS — R5381 Other malaise: Secondary | ICD-10-CM | POA: Insufficient documentation

## 2016-03-29 ENCOUNTER — Other Ambulatory Visit: Payer: Self-pay | Admitting: Gastroenterology

## 2016-04-09 ENCOUNTER — Ambulatory Visit (HOSPITAL_COMMUNITY)
Admission: RE | Admit: 2016-04-09 | Discharge: 2016-04-09 | Disposition: A | Discharge status: Home / Self Care | Payer: Medicare Other | Source: Ambulatory Visit | Attending: Gastroenterology | Admitting: Gastroenterology

## 2016-04-09 ENCOUNTER — Encounter (HOSPITAL_COMMUNITY): Payer: Self-pay

## 2016-04-09 ENCOUNTER — Encounter (HOSPITAL_COMMUNITY)
Admission: RE | Disposition: A | Discharge status: Home / Self Care | Payer: Self-pay | Source: Ambulatory Visit | Attending: Gastroenterology

## 2016-04-09 DIAGNOSIS — I69859 Hemiplegia and hemiparesis following other cerebrovascular disease affecting unspecified side: Secondary | ICD-10-CM | POA: Insufficient documentation

## 2016-04-09 DIAGNOSIS — N183 Chronic kidney disease, stage 3 (moderate): Secondary | ICD-10-CM | POA: Insufficient documentation

## 2016-04-09 DIAGNOSIS — I129 Hypertensive chronic kidney disease with stage 1 through stage 4 chronic kidney disease, or unspecified chronic kidney disease: Secondary | ICD-10-CM | POA: Diagnosis not present

## 2016-04-09 DIAGNOSIS — R195 Other fecal abnormalities: Secondary | ICD-10-CM | POA: Diagnosis present

## 2016-04-09 DIAGNOSIS — F17290 Nicotine dependence, other tobacco product, uncomplicated: Secondary | ICD-10-CM | POA: Diagnosis not present

## 2016-04-09 DIAGNOSIS — K5730 Diverticulosis of large intestine without perforation or abscess without bleeding: Secondary | ICD-10-CM | POA: Insufficient documentation

## 2016-04-09 DIAGNOSIS — D649 Anemia, unspecified: Secondary | ICD-10-CM | POA: Diagnosis not present

## 2016-04-09 DIAGNOSIS — E039 Hypothyroidism, unspecified: Secondary | ICD-10-CM | POA: Diagnosis not present

## 2016-04-09 HISTORY — PX: COLONOSCOPY WITH PROPOFOL: SHX5780

## 2016-04-09 SURGERY — COLONOSCOPY WITH PROPOFOL
Anesthesia: Moderate Sedation

## 2016-04-09 MED ORDER — FENTANYL CITRATE (PF) 100 MCG/2ML IJ SOLN
INTRAMUSCULAR | Status: DC | PRN
  Administered 2016-04-09 (×2): 25 ug via INTRAVENOUS

## 2016-04-09 MED ORDER — FENTANYL CITRATE (PF) 100 MCG/2ML IJ SOLN
INTRAMUSCULAR | Status: AC
  Filled 2016-04-09: qty 2

## 2016-04-09 MED ORDER — MIDAZOLAM HCL 5 MG/5ML IJ SOLN
INTRAMUSCULAR | Status: DC | PRN
  Administered 2016-04-09 (×2): 2 mg via INTRAVENOUS

## 2016-04-09 MED ORDER — MIDAZOLAM HCL 5 MG/ML IJ SOLN
INTRAMUSCULAR | Status: AC
  Filled 2016-04-09: qty 2

## 2016-04-09 MED ORDER — SODIUM CHLORIDE 0.9 % IV SOLN
INTRAVENOUS | Status: DC
  Administered 2016-04-09: 500 mL via INTRAVENOUS

## 2016-04-09 SURGICAL SUPPLY — 21 items

## 2016-04-09 NOTE — Op Note (Signed)
[facility] Patient Name: Steven Krueger Procedure Date: 04/09/2016 MRN: [id number] Attending MD: [name] , MD Date of Birth: 06-04-1935 CSN: [id number] Age: 81 Admit Type: Outpatient Procedure:                Colonoscopy Indications:              Heme positive stool Providers:                [name], MD, Tomma Rakers, RN, Jacquiline Doe, RN, Clearnce Sorrel, Technician Referring MD:              Medicines:                Fentanyl 50 micrograms IV, Midazolam 4 mg IV Complications:            No immediate complications. Estimated Blood Loss:     Estimated blood loss: none. Procedure:                Pre-Anesthesia Assessment:                           - Prior to the procedure, a History and Physical                            was performed, and patient medications and                            allergies were reviewed. The patient's tolerance of                            previous anesthesia was also reviewed. The risks                            and benefits of the procedure and the sedation                            options and risks were discussed with the patient.                            All questions were answered, and informed consent                            was obtained. Prior Anticoagulants: The patient has                            taken no previous anticoagulant or antiplatelet                            agents. ASA Grade Assessment: III - A patient with                            severe systemic disease. After reviewing the risks  and benefits, the patient was deemed in                            satisfactory condition to undergo the procedure.                           - Sedation was administered by an endoscopy nurse.                            The sedation level attained was moderate.                           After obtaining informed consent, the colonoscope                            was  passed under direct vision. Throughout the                            procedure, the patient's blood pressure, pulse, and                            oxygen saturations were monitored continuously. The                            EC-[id number] ([id number]) scope was introduced through                            the anus and advanced to the the cecum, identified                            by appendiceal orifice and ileocecal valve. The                            colonoscopy was performed without difficulty. The                            patient tolerated the procedure well. The quality                            of the bowel preparation was excellent. The                            ileocecal valve, appendiceal orifice, and rectum                            were photographed. Scope In: 9:53:42 AM Scope Out: 10:14:54 AM Scope Withdrawal Time: 0 hours 11 minutes 24 seconds  Total Procedure Duration: 0 hours 21 minutes 12 seconds  Findings:      A few small-mouthed diverticula were found in the sigmoid colon. Impression:               - Diverticulosis in the sigmoid colon.                           - No  specimens collected. Moderate Sedation:      Moderate (conscious) sedation was administered by the endoscopy nurse       and supervised by the endoscopist. The following parameters were       monitored: oxygen saturation, heart rate, blood pressure, and response       to care. Recommendation:           - Patient has a contact number available for                            emergencies. The signs and symptoms of potential                            delayed complications were discussed with the                            patient. Return to normal activities tomorrow.                            Written discharge instructions were provided to the                            patient.                           - Resume previous diet.                           - Continue present medications.                            - No repeat colonoscopy due to age and the absence                            of colonic polyps. Procedure Code(s):        --- Professional ---                           [phone], Colonoscopy, flexible; diagnostic, including                            collection of specimen(s) by brushing or washing,                            when performed (separate procedure) Diagnosis Code(s):        --- Professional ---                           R19.5, Other fecal abnormalities                           K57.30, Diverticulosis of large intestine without                            perforation or abscess without bleeding CPT copyright 2016 American Medical Association. All rights reserved. The codes documented in this report are preliminary and upon coder review may  be revised to meet current compliance requirements. [name], MD [name], MD 04/09/2016 10:19:52 AM This report has been signed electronically. Number of Addenda: 0

## 2016-04-09 NOTE — Discharge Instructions (Signed)

## 2016-04-09 NOTE — H&P (Signed)
  Steven Krueger HPI: On routine check up the patient was identfied to have heme positive stool after he was noted to have a mild anemia of 13.5 g/dL (28-31 ref value). In 1988 he had issues with hematochezia and an EGD/Colonoscopy was performed with findings of a bleeding gastric ulcer, per his report. He denies any subsequent colonoscopies and there is no known family history of colon cancer. There is no report of hematochezia, melena, diarrhea, constipation, abdominal pain, or family history of colon cancer.  Past Medical History  Diagnosis Date  . Hyperlipidemia   . Kidney stones   . Hypertension     takes Prinzide daily  . Hypothyroidism     takes Synthroid daily  . Ventricular bigeminy 06/27/2014  . 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 Chronic kidney disease, stage 3 04/01/2014  . CVA, old, hemiparesis (HCC) 04/01/2014  . 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  Family History  Problem Relation Age of Onset  . Heart disease    . Hypertension    . Cancer    . Cancer Father     Social History:  reports that he has quit smoking. His smokeless tobacco use includes Chew. He reports that he does not drink alcohol or use illicit drugs.  Allergies: No Known Allergies  Medications: Scheduled: Continuous:  No results found for this or any previous visit (from the past 24 hour(s)).   No results found.  ROS:  As stated above in the HPI otherwise negative.  There were no vitals taken for this visit.    PE: Gen: NAD, Alert and Oriented HEENT:  [place]/AT, EOMI Neck: Supple, no LAD Lungs: CTA Bilaterally CV: RRR without M/G/R ABM: Soft, NTND, +BS Ext: No C/C/E  Assessment/Plan: 1) Heme positive stool - Colonoscopy.  Jeani Fassnacht D 04/09/2016, 7:22 AM

## 2016-04-12 ENCOUNTER — Encounter (HOSPITAL_COMMUNITY): Payer: Self-pay | Admitting: Gastroenterology

## 2016-05-28 ENCOUNTER — Telehealth: Payer: Self-pay | Admitting: Orthopaedic Surgery

## 2016-06-25 ENCOUNTER — Telehealth: Payer: Self-pay | Admitting: Orthopaedic Surgery

## 2016-11-29 ENCOUNTER — Telehealth: Payer: Self-pay | Admitting: Orthopaedic Surgery

## 2016-12-30 ENCOUNTER — Ambulatory Visit (INDEPENDENT_AMBULATORY_CARE_PROVIDER_SITE_OTHER): Payer: Medicare Other | Admitting: Interventional Cardiology

## 2016-12-30 ENCOUNTER — Encounter: Payer: Self-pay | Admitting: Interventional Cardiology

## 2016-12-30 VITALS — BP 118/72 | HR 61 | Ht 70.0 in | Wt 179.1 lb

## 2016-12-30 DIAGNOSIS — Z01810 Encounter for preprocedural cardiovascular examination: Secondary | ICD-10-CM

## 2016-12-30 DIAGNOSIS — I453 Trifascicular block: Secondary | ICD-10-CM

## 2016-12-30 DIAGNOSIS — I499 Cardiac arrhythmia, unspecified: Secondary | ICD-10-CM | POA: Diagnosis not present

## 2016-12-30 DIAGNOSIS — I10 Essential (primary) hypertension: Secondary | ICD-10-CM | POA: Diagnosis not present

## 2016-12-30 DIAGNOSIS — I493 Ventricular premature depolarization: Secondary | ICD-10-CM | POA: Diagnosis not present

## 2016-12-30 DIAGNOSIS — I498 Other specified cardiac arrhythmias: Secondary | ICD-10-CM

## 2016-12-30 NOTE — Patient Instructions (Addendum)
Medication Instructions:  None  Labwork: None  Testing/Procedures: Your physician has requested that you have an echocardiogram. Echocardiography is a painless test that uses sound waves to create images of your heart. It provides your doctor with information about the size and shape of your heart and how well your heart's chambers and valves are working. This procedure takes approximately one hour. There are no restrictions for this procedure.    Follow-Up: Your physician recommends that you schedule a follow-up appointment in our Electrophysiology department for consult forTrifascicular block.       Any Other Special Instructions Will Be Listed Below (If Applicable).     If you need a refill on your cardiac medications before your next appointment, please call your pharmacy.

## 2016-12-30 NOTE — Progress Notes (Signed)
Will reassess   Cardiology Office Note    Date:  12/30/2016   ID:  Steven Krueger, DOB 1935/08/31, MRN [id number]  PCP:  Pamelia Hoit, MD  Cardiologist: Lesleigh Noe, MD   Chief Complaint  Patient presents with  . Bradycardia    History of Present Illness:  Steven Krueger is a 82 y.o. male with history of trifascicular block, frequent PVCs, hypertension, prior stroke, stage III chronic kidney disease, who is seeking clearance for right knee replacement by Dr. Lequita Halt.  The patient is asymptomatic. History and it is right knee replaced. 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 Echo included a normal LV function and heart structure when evaluated at that time. No intervening cardiac issues. He is back again now after having a 48-hour Holter which demonstrated . Please see interpretation below.  Past Medical History:  Diagnosis Date  . Chronic kidney disease, stage 3 04/01/2014  . CVA, old, hemiparesis (HCC) 04/01/2014  . Hyperlipidemia   . Hypertension    takes Prinzide daily  . Hypothyroidism    takes Synthroid daily  . Kidney stones   . 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 Ventricular bigeminy 06/27/2014    Past Surgical History:  Procedure Laterality Date  . COLONOSCOPY WITH PROPOFOL N/A 04/09/2016   Procedure: COLONOSCOPY WITH PROPOFOL;  Surgeon: [name], MD;  Location: [facility];  Service: Endoscopy;  Laterality: N/A;    Current Medications: Outpatient Medications Prior to Visit  Medication Sig Dispense Refill  . allopurinol (ZYLOPRIM) 300 MG tablet TAKE ONE TABLET BY MOUTH EVERY DAY 30 tablet 5  . aspirin 325 MG tablet Take 325 mg by mouth daily.    Marland Kitchen levothyroxine (SYNTHROID, LEVOTHROID) 25 MCG tablet Take 25 mcg by mouth daily.    Marland Kitchen  lisinopril-hydrochlorothiazide (PRINZIDE,ZESTORETIC) 20-12.5 MG per tablet Take 1 tablet by mouth daily.      No facility-administered medications prior to visit.      Allergies:   Patient has no known allergies.   Social History   Social History  . Marital status: Married    Spouse name: N/A  . Number of children: 2  . Years of education: N/A   Occupational History  . retired    Social History Main Topics  . Smoking status: Former Games developer  . Smokeless tobacco: Current User    Types: Chew  . Alcohol use No  . Drug use: No  . Sexual activity: Not Asked   Other Topics Concern  . None   Social History Narrative  . None     Family History:  The patient's family history includes Cancer in his father.   ROS:   Please see the history of present illness.    Severe limitation due to right knee. No cardiopulmonary complaints. Back pain, muscle pain, and cough. Skipped heartbeats. No neurological complaints.  All other systems reviewed and are negative.   PHYSICAL EXAM:   VS:  BP 118/72 (BP Location: Left Arm)   Pulse 61   Ht 5\' 10"  (1.778 m)   Wt 179 lb 1.9 oz (81.2 kg)   BMI 25.70 kg/m    GEN: Well nourished, well developed, in no acute distress  HEENT: normal  Neck: no JVD, carotid bruits, or masses Cardiac: IRR; no murmurs, rubs, or gallops,no edema  Respiratory:  clear to auscultation bilaterally, normal work of breathing GI: soft, nontender, nondistended, + BS MS: no deformity or atrophy  Skin: warm and dry, no rash Neuro:  Alert and Oriented x 3, Strength and sensation are intact Psych: euthymic mood, full affect  Wt Readings from Last 3 Encounters:  12/30/16 179 lb 1.9 oz (81.2 kg)  04/09/16 184 lb (83.5 kg)  11/20/15 184 lb 12.8 oz (83.8 kg)      Studies/Labs Reviewed:   EKG:  EKG  PR interval 408 ms increased from prior tracing one year ago when it was 320. Right bundle branch block, left anterior hemiblock, amp PVC is noted. QRS duration is 170 ms  which is slightly less on the prior tracing in February 2017, when it was 174 ms.  Recent Labs: No results found for requested labs within last 8760 hours.   Lipid Panel    Component Value Date/Time   CHOL  10/30/2009 0604    143        ATP III CLASSIFICATION:  <200     mg/dL   Desirable  161-096  mg/dL   Borderline High  >=045    mg/dL   High          TRIG 73 10/30/2009 0604   HDL 24 (L) 10/30/2009 0604   CHOLHDL 6.0 10/30/2009 0604   VLDL 15 10/30/2009 0604   LDLCALC (H) 10/30/2009 0604    104        Total Cholesterol/HDL:CHD Risk Coronary Heart Disease Risk Table                     Men   Women  1/2 Average Risk   3.4   3.3  Average Risk       5.0   4.4  2 X Average Risk   9.6   7.1  3 X Average Risk  23.4   11.0        Use the calculated Patient Ratio above and the CHD Risk Table to determine the patient's CHD Risk.        ATP III CLASSIFICATION (LDL):  <100     mg/dL   Optimal  409-811  mg/dL   Near or Above                    Optimal  130-159  mg/dL   Borderline  914-782  mg/dL   High  >956     mg/dL   Very High    Additional studies/ records that were reviewed today include:  48 hour Holter monitor 2 /7/17: Study Highlights    Normal sinus rhythm with heart rate range 53-135 bpm with average heart rate 84 bpm  Frequent predominantly isolated premature ventricular contractions (13% of all activity)  Brief nonsustained VT lasting up to 8 beats.  Rare PACs  First-degree AV block with intraventricular conduction abnormality   Significant atrioventricular and intraventricular conduction abnormality. Frequent PVCs with rare episodes of non-sustained WCT (less than 8 beats) Basic rhythm is sinus rhythm with first-degree AV block.       ASSESSMENT:    1. Preoperative cardiovascular examination   2. Trifascicular block   3. Essential hypertension   4. Bigeminy   5. PVC (premature ventricular contraction)      PLAN:  In order of problems  listed above: 1. Will hold clearance until we have an updated echocardiogram. If LV function is okay my inclination is to clear him for surgery despite his conduction  abnormality. My concern is induction and intubation may precipitate  higher-grade AV block, which in the surgical setting could be catastrophic. We'll get an EP opinion. 2. Rule out need for permanent pacemaker. See above discussion. ECG on this occasion shows even further lengthening of the PR interval to 408 ms. I have decided to let EP hold my hand. Perhaps all that is needed is standby external pacemaker to allow surgery. Will await a higher level opinion. 3. Excellent blood pressure control. 4. Frequent PVCs. Exclude LV dysfunction. EP to also consider  We will reassess LV function to document no change from prior study 3 years ago. Will have EP see the patient, although I feel that nothing further will need to be done. My concern is about the stress of surgery/intubation and the possibility of higher grade AV block that could cause havoc during the procedure. The patient does need chronic follow-up, as I think he will need a pacemaker at some point.    Medication Adjustments/Labs and Tests Ordered: Current medicines are reviewed at length with the patient today.  Concerns regarding medicines are outlined above.  Medication changes, Labs and Tests ordered today are listed in the Patient Instructions below. Patient Instructions  Medication Instructions:  None  Labwork: None  Testing/Procedures: Your physician has requested that you have an echocardiogram. Echocardiography is a painless test that uses sound waves to create images of your heart. It provides your doctor with information about the size and shape of your heart and how well your heart's chambers and valves are working. This procedure takes approximately one hour. There are no restrictions for this procedure.    Follow-Up: Your physician recommends that you  schedule a follow-up appointment in our Electrophysiology department for consult forTrifascicular block.       Any Other Special Instructions Will Be Listed Below (If Applicable).     If you need a refill on your cardiac medications before your next appointment, please call your pharmacy.      Signed, Lesleigh Noe, MD  12/30/2016 11:52 AM    [facility] [address] [place], [place], [place]  [address] Phone: [phone]; Fax: [phone]

## 2017-01-14 ENCOUNTER — Ambulatory Visit (HOSPITAL_COMMUNITY): Payer: Medicare Other | Attending: Cardiology

## 2017-01-14 ENCOUNTER — Other Ambulatory Visit: Payer: Self-pay

## 2017-01-14 DIAGNOSIS — N183 Chronic kidney disease, stage 3 (moderate): Secondary | ICD-10-CM | POA: Diagnosis not present

## 2017-01-14 DIAGNOSIS — I453 Trifascicular block: Secondary | ICD-10-CM | POA: Diagnosis not present

## 2017-01-14 DIAGNOSIS — I493 Ventricular premature depolarization: Secondary | ICD-10-CM | POA: Insufficient documentation

## 2017-01-14 DIAGNOSIS — I351 Nonrheumatic aortic (valve) insufficiency: Secondary | ICD-10-CM | POA: Diagnosis not present

## 2017-01-14 DIAGNOSIS — I77810 Thoracic aortic ectasia: Secondary | ICD-10-CM | POA: Insufficient documentation

## 2017-01-14 DIAGNOSIS — I129 Hypertensive chronic kidney disease with stage 1 through stage 4 chronic kidney disease, or unspecified chronic kidney disease: Secondary | ICD-10-CM | POA: Insufficient documentation

## 2017-01-14 DIAGNOSIS — Z87891 Personal history of nicotine dependence: Secondary | ICD-10-CM | POA: Diagnosis not present

## 2017-01-17 ENCOUNTER — Ambulatory Visit (INDEPENDENT_AMBULATORY_CARE_PROVIDER_SITE_OTHER): Payer: Medicare Other | Admitting: Cardiology

## 2017-01-17 ENCOUNTER — Encounter: Payer: Self-pay | Admitting: Cardiology

## 2017-01-17 VITALS — BP 130/56 | HR 68 | Ht 70.0 in | Wt 182.0 lb

## 2017-01-17 DIAGNOSIS — I493 Ventricular premature depolarization: Secondary | ICD-10-CM | POA: Diagnosis not present

## 2017-01-17 NOTE — Patient Instructions (Signed)
Medication Instructions:    Your physician recommends that you continue on your current medications as directed. Please refer to the Current Medication list given to you today.  - If you need a refill on your cardiac medications before your next appointment, please call your pharmacy.   Labwork:  None ordered  Testing/Procedures:  None ordered  Follow-Up:  Your physician recommends that you schedule a follow-up appointment in: 3 months with Dr. Camnitz.  Thank you for choosing [facility]!!   Faithlyn Recktenwald, RN [phone]         

## 2017-01-17 NOTE — Progress Notes (Signed)
Electrophysiology Office Note   Date:  01/17/2017   ID:  [name], DOB [birth year], MRN [id number]  PCP:  Pamelia Hoit, MD  Cardiologist:  Katrinka Blazing Primary Electrophysiologist:  Yuvin Bussiere [name], MD    No chief complaint on file.    History of Present Illness: Steven Krueger is a 82 y.o. male who presents today for electrophysiology evaluation.   Steven Krueger is a 82 y.o. male who is being seen today for the evaluation of PVCs and bradycardia at the request of [name], MDHistory of trifascicular block, frequent PVCs, hypertension, prior stroke, stage III chronic kidney disease.  Today, he denies symptoms of palpitations, chest pain, shortness of breath, orthopnea, PND, lower extremity edema, claudication, dizziness, presyncope, syncope, bleeding, or neurologic sequela. The patient is tolerating medications without difficulties and is otherwise without complaint today. He has no episodes of fatigue, shortness of breath, or weakness. He does have significant conduction system disease. On his EKG today, he has a right bundle-branch block with a significant PR lengthening. He is also in ventricular bigeminy.   Past Medical History:  Diagnosis Date  . Chronic kidney disease, stage 3 04/01/2014  . CVA, old, hemiparesis (HCC) 04/01/2014  . Hyperlipidemia   . Hypertension    takes Prinzide daily  . Hypothyroidism    takes Synthroid daily  . Kidney stones   . 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 Ventricular bigeminy 06/27/2014   Past Surgical History:  Procedure Laterality Date  . COLONOSCOPY WITH PROPOFOL N/A 04/09/2016   Procedure: COLONOSCOPY WITH PROPOFOL;  Surgeon: [name], MD;  Location: [facility];  Service: Endoscopy;  Laterality: N/A;     Current Outpatient Prescriptions  Medication Sig Dispense Refill  . allopurinol  (ZYLOPRIM) 300 MG tablet TAKE ONE TABLET BY MOUTH EVERY DAY 30 tablet 5  . aspirin 325 MG tablet Take 325 mg by mouth daily.    Marland Kitchen levothyroxine (SYNTHROID, LEVOTHROID) 25 MCG tablet Take 25 mcg by mouth daily.    Marland Kitchen lisinopril-hydrochlorothiazide (PRINZIDE,ZESTORETIC) 20-12.5 MG per tablet Take 1 tablet by mouth daily.      No current facility-administered medications for this visit.     Allergies:   Patient has no known allergies.   Social History:  The patient  reports that he has quit smoking. His smokeless tobacco use includes Chew. He reports that he does not drink alcohol or use drugs.   Family History:  The patient's family history includes Cancer in his father.    ROS:  Please see the history of present illness.   Otherwise, review of systems is positive for none.   All other systems are reviewed and negative.    PHYSICAL EXAM: VS:  BP (!) 130/56 (BP Location: Right Arm, Patient Position: Sitting, Cuff Size: Normal)   Pulse 68   Ht 5\' 10"  (1.778 m)   Wt 182 lb (82.6 kg)   SpO2 94%   BMI 26.11 kg/m  , BMI Body mass index is 26.11 kg/m. GEN: Well nourished, well developed, in no acute distress  HEENT: normal  Neck: no JVD, carotid bruits, or masses Cardiac: iRRR; no murmurs, rubs, or gallops,no edema  Respiratory:  clear to auscultation bilaterally, normal work of breathing GI: soft, nontender, nondistended, + BS MS: no deformity or atrophy  Skin: warm and dry Neuro:  Strength and sensation are intact Psych: euthymic  mood, full affect  EKG:  EKG is ordered today. Personal review of the ekg ordered shows Sinus rhythm, right bundle branch block, first-degree AV block, PR 322, ventricular bigeminy  Recent Labs: No results found for requested labs within last 8760 hours.    Lipid Panel     Component Value Date/Time   CHOL  10/30/2009 0604    143        ATP III CLASSIFICATION:  <200     mg/dL   Desirable  428-768  mg/dL   Borderline High  >=115    mg/dL   High           TRIG 73 10/30/2009 0604   HDL 24 (L) 10/30/2009 0604   CHOLHDL 6.0 10/30/2009 0604   VLDL 15 10/30/2009 0604   LDLCALC (H) 10/30/2009 0604    104        Total Cholesterol/HDL:CHD Risk Coronary Heart Disease Risk Table                     Men   Women  1/2 Average Risk   3.4   3.3  Average Risk       5.0   4.4  2 X Average Risk   9.6   7.1  3 X Average Risk  23.4   11.0        Use the calculated Patient Ratio above and the CHD Risk Table to determine the patient's CHD Risk.        ATP III CLASSIFICATION (LDL):  <100     mg/dL   Optimal  726-203  mg/dL   Near or Above                    Optimal  130-159  mg/dL   Borderline  559-741  mg/dL   High  >638     mg/dL   Very High     Wt Readings from Last 3 Encounters:  01/17/17 182 lb (82.6 kg)  12/30/16 179 lb 1.9 oz (81.2 kg)  04/09/16 184 lb (83.5 kg)      Other studies Reviewed: Additional studies/ records that were reviewed today include: TTE 01/14/17  Review of the above records today demonstrates:  - Left ventricle: The cavity size was normal. Wall thickness was   normal. Systolic function was normal. The estimated ejection   fraction was in the range of 55% to 60%. Wall motion was normal;   there were no regional wall motion abnormalities. Doppler   parameters are consistent with abnormal left ventricular   relaxation (grade 1 diastolic dysfunction). - Aortic valve: There was mild regurgitation. - Aorta: Aortic root dimension: 39 mm (ED). - Ascending aorta: The ascending aorta was mildly dilated.  Holter 12/04/15  Normal sinus rhythm with heart rate range 53-135 bpm with average heart rate 84 bpm  Frequent predominantly isolated premature ventricular contractions (13% of all activity)  Brief nonsustained VT lasting up to 8 beats.  Rare PACs  First-degree AV block with intraventricular conduction abnormality   Significant atrioventricular and intraventricular conduction abnormality. Frequent PVCs with  rare episodes of non-sustained WCT (less than 8 beats) Basic rhythm is sinus rhythm with first-degree AV block.  ASSESSMENT AND PLAN:  1.  Trifascicular block: Currently has bifascicular block on his EKG with PVCs. He does not have any symptoms of fatigue, weakness, shortness of breath, or syncope. I discussed with him that if he does have the symptoms, he would benefit from a pacemaker at  that time. We'll continue to further monitor.  2. Hypertension: Well-controlled today  3. PVCs: Is in ventricular bigeminy today without symptoms. 13% PVCs on his Holter monitor. We'll continue to monitor. His do appear to be coming from the outflow tracts.    Current medicines are reviewed at length with the patient today.   The patient does not have concerns regarding his medicines.  The following changes were made today:  none  Labs/ tests ordered today include:  Orders Placed This Encounter  Procedures  . EKG 12-Lead     Disposition:   FU with Shaydon Lease 3 months  Signed, Penina Reisner [name], MD  01/17/2017 11:31 AM     [facility] [address] [place] [address] [phone] (office) [phone] (fax)

## 2017-04-19 ENCOUNTER — Ambulatory Visit (INDEPENDENT_AMBULATORY_CARE_PROVIDER_SITE_OTHER): Payer: Medicare Other | Admitting: Cardiology

## 2017-04-19 ENCOUNTER — Encounter: Payer: Self-pay | Admitting: Cardiology

## 2017-04-19 VITALS — BP 110/72 | HR 63 | Ht 70.0 in | Wt 175.0 lb

## 2017-04-19 DIAGNOSIS — I10 Essential (primary) hypertension: Secondary | ICD-10-CM | POA: Diagnosis not present

## 2017-04-19 DIAGNOSIS — I453 Trifascicular block: Secondary | ICD-10-CM | POA: Diagnosis not present

## 2017-04-19 DIAGNOSIS — I493 Ventricular premature depolarization: Secondary | ICD-10-CM | POA: Diagnosis not present

## 2017-04-19 MED ORDER — ASPIRIN EC 81 MG PO TBEC: 81.0000 mg | DELAYED_RELEASE_TABLET | Freq: Every day | ORAL | 3 refills | Status: DC

## 2017-04-19 NOTE — Progress Notes (Signed)
Electrophysiology Office Note   Date:  04/19/2017   ID:  Steven Krueger, DOB September 05, 1935, MRN [id number]  PCP:  [name], MD  Cardiologist:  Katrinka Blazing Primary Electrophysiologist:  Nellene Courtois [name], MD    Chief Complaint  Patient presents with  . Follow-up    PVC's     History of Present Illness: Steven Krueger is a 82 y.o. male who presents today for electrophysiology evaluation.   Steven Krueger is a 82 y.o. male who is being seen today for the evaluation of PVCs and bradycardia at the request of [name], MDHistory of trifascicular block, frequent PVCs, hypertension, prior stroke, stage III chronic kidney disease.  Today, denies symptoms of palpitations, chest pain, shortness of breath, orthopnea, PND, lower extremity edema, claudication, dizziness, presyncope, syncope, bleeding, or neurologic sequela. The patient is tolerating medications without difficulties and is otherwise without complaint today. He cannot tell that he is in ventricular bigeminy. He has no complaints of shortness of breath or chest pain. He has not had fatigue. He is able to work in his garden and no his lawn without issues.   Past Medical History:  Diagnosis Date  . Chronic kidney disease, stage 3 04/01/2014  . CVA, old, hemiparesis (HCC) 04/01/2014  . Hyperlipidemia   . Hypertension    takes Prinzide daily  . Hypothyroidism    takes Synthroid daily  . Kidney stones   . 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 Ventricular bigeminy 06/27/2014   Past Surgical History:  Procedure Laterality Date  . COLONOSCOPY WITH PROPOFOL N/A 04/09/2016   Procedure: COLONOSCOPY WITH PROPOFOL;  Surgeon: [name], MD;  Location: [facility];  Service: Endoscopy;  Laterality: N/A;     Current Outpatient Prescriptions  Medication Sig Dispense Refill  . allopurinol (ZYLOPRIM) 300 MG  tablet TAKE ONE TABLET BY MOUTH EVERY DAY 30 tablet 5  . aspirin 325 MG tablet Take 325 mg by mouth daily.    Marland Kitchen levothyroxine (SYNTHROID, LEVOTHROID) 25 MCG tablet Take 25 mcg by mouth daily.    Marland Kitchen lisinopril-hydrochlorothiazide (PRINZIDE,ZESTORETIC) 20-12.5 MG per tablet Take 1 tablet by mouth daily.      No current facility-administered medications for this visit.     Allergies:   Patient has no known allergies.   Social History:  The patient  reports that he has quit smoking. His smokeless tobacco use includes Chew. He reports that he does not drink alcohol or use drugs.   Family History:  The patient's family history includes Cancer in his father.    ROS:  Please see the history of present illness.   Otherwise, review of systems is positive for Leg pain, snoring, balance problems, back pain, muscle pain.   All other systems are reviewed and negative.    PHYSICAL EXAM: VS:  BP 110/72   Pulse 63   Ht 5\' 10"  (1.778 m)   Wt 175 lb (79.4 kg)   BMI 25.11 kg/m  , BMI Body mass index is 25.11 kg/m. GEN: Well nourished, well developed, in no acute distress  HEENT: normal  Neck: no JVD, carotid bruits, or masses Cardiac: iRRR; no murmurs, rubs, or gallops,no edema  Respiratory:  clear to auscultation bilaterally, normal work of breathing GI: soft, nontender, nondistended, + BS MS: no deformity or atrophy  Skin: warm and dry Neuro:  Strength and sensation are intact  Psych: euthymic mood, full affect  EKG:  EKG is ordered today. Personal review of the ekg ordered shows Sinus rhythm, rate 63, first-degree AV block, right bundle branch block, left anterior fascicular block PVCs with ventricular bigeminy  Recent Labs: No results found for requested labs within last 8760 hours.    Lipid Panel     Component Value Date/Time   CHOL  10/30/2009 0604    143        ATP III CLASSIFICATION:  <200     mg/dL   Desirable  716-967  mg/dL   Borderline High  >=893    mg/dL   High           TRIG 73 10/30/2009 0604   HDL 24 (L) 10/30/2009 0604   CHOLHDL 6.0 10/30/2009 0604   VLDL 15 10/30/2009 0604   LDLCALC (H) 10/30/2009 0604    104        Total Cholesterol/HDL:CHD Risk Coronary Heart Disease Risk Table                     Men   Women  1/2 Average Risk   3.4   3.3  Average Risk       5.0   4.4  2 X Average Risk   9.6   7.1  3 X Average Risk  23.4   11.0        Use the calculated Patient Ratio above and the CHD Risk Table to determine the patient's CHD Risk.        ATP III CLASSIFICATION (LDL):  <100     mg/dL   Optimal  810-175  mg/dL   Near or Above                    Optimal  130-159  mg/dL   Borderline  102-585  mg/dL   High  >277     mg/dL   Very High     Wt Readings from Last 3 Encounters:  04/19/17 175 lb (79.4 kg)  01/17/17 182 lb (82.6 kg)  12/30/16 179 lb 1.9 oz (81.2 kg)      Other studies Reviewed: Additional studies/ records that were reviewed today include: TTE 01/14/17  Review of the above records today demonstrates:  - Left ventricle: The cavity size was normal. Wall thickness was   normal. Systolic function was normal. The estimated ejection   fraction was in the range of 55% to 60%. Wall motion was normal;   there were no regional wall motion abnormalities. Doppler   parameters are consistent with abnormal left ventricular   relaxation (grade 1 diastolic dysfunction). - Aortic valve: There was mild regurgitation. - Aorta: Aortic root dimension: 39 mm (ED). - Ascending aorta: The ascending aorta was mildly dilated.  Holter 12/04/15  Normal sinus rhythm with heart rate range 53-135 bpm with average heart rate 84 bpm  Frequent predominantly isolated premature ventricular contractions (13% of all activity)  Brief nonsustained VT lasting up to 8 beats.  Rare PACs  First-degree AV block with intraventricular conduction abnormality   Significant atrioventricular and intraventricular conduction abnormality. Frequent PVCs with rare  episodes of non-sustained WCT (less than 8 beats) Basic rhythm is sinus rhythm with first-degree AV block.  ASSESSMENT AND PLAN:  1.  Trifascicular block: Has a right bundle-branch block, left anterior fascicular block and first degree AV block on his EKG. He is currently asymptomatic. No indication for pacemaker at this time. We did discuss the possibility  of pacemaker in the future. He Ceara Wrightson call if he has episodes of fatigue, dizziness, lightheadedness.  2. Hypertension: Well-controlled today.  3. PVCs: 13% on most recent monitor. PVCs appear to be coming from the left ventricle around the mitral valve. He is asymptomatic at this time. Should he become symptomatic, he would likely be on either amiodarone or flecainide. Would prefer to avoid AV nodal blocking agents as this could exacerbate his conduction system disease.    Current medicines are reviewed at length with the patient today.   The patient does not have concerns regarding his medicines.  The following changes were made today:  Decrease aspirin to 81 mg  Labs/ tests ordered today include:  No orders of the defined types were placed in this encounter.    Disposition:   FU with Jolynne Spurgin 6 months  Signed, Brynleigh Sequeira [name], MD  04/19/2017 10:19 AM     [facility] [address] [place] [address] [phone] (office) [phone] (fax)

## 2017-04-19 NOTE — Patient Instructions (Signed)
Medication Instructions:  Your physician has recommended you make the following change in your medication:  1. DECREASE Aspirin to 81 mg daily  -- If you need a refill on your cardiac medications before your next appointment, please call your pharmacy. --  Labwork: None ordered  Testing/Procedures: None ordered  Follow-Up: Your physician wants you to follow-up in: 6 months with Dr. Camnitz.  You will receive a reminder letter in the mail two months in advance. If you don't receive a letter, please call our office to schedule the follow-up appointment.  Thank you for choosing [facility]!!   Pheonix Clinkscale, RN [phone]       

## 2017-06-02 ENCOUNTER — Telehealth: Payer: Self-pay | Admitting: Orthopaedic Surgery

## 2017-08-13 ENCOUNTER — Emergency Department (HOSPITAL_COMMUNITY): Payer: Medicare Other

## 2017-08-13 ENCOUNTER — Encounter (HOSPITAL_COMMUNITY): Payer: Self-pay | Admitting: *Deleted

## 2017-08-13 ENCOUNTER — Emergency Department (HOSPITAL_COMMUNITY)
Admission: EM | Admit: 2017-08-13 | Discharge: 2017-08-13 | Disposition: A | Discharge status: Home / Self Care | Payer: Medicare Other | Attending: Emergency Medicine | Admitting: Emergency Medicine

## 2017-08-13 DIAGNOSIS — R109 Unspecified abdominal pain: Secondary | ICD-10-CM | POA: Diagnosis not present

## 2017-08-13 DIAGNOSIS — Z79899 Other long term (current) drug therapy: Secondary | ICD-10-CM | POA: Insufficient documentation

## 2017-08-13 DIAGNOSIS — N183 Chronic kidney disease, stage 3 (moderate): Secondary | ICD-10-CM | POA: Insufficient documentation

## 2017-08-13 DIAGNOSIS — Y9389 Activity, other specified: Secondary | ICD-10-CM | POA: Insufficient documentation

## 2017-08-13 DIAGNOSIS — I129 Hypertensive chronic kidney disease with stage 1 through stage 4 chronic kidney disease, or unspecified chronic kidney disease: Secondary | ICD-10-CM | POA: Insufficient documentation

## 2017-08-13 DIAGNOSIS — S51012A Laceration without foreign body of left elbow, initial encounter: Secondary | ICD-10-CM | POA: Diagnosis not present

## 2017-08-13 DIAGNOSIS — Y998 Other external cause status: Secondary | ICD-10-CM | POA: Insufficient documentation

## 2017-08-13 DIAGNOSIS — Z7982 Long term (current) use of aspirin: Secondary | ICD-10-CM | POA: Insufficient documentation

## 2017-08-13 DIAGNOSIS — F17220 Nicotine dependence, chewing tobacco, uncomplicated: Secondary | ICD-10-CM | POA: Insufficient documentation

## 2017-08-13 DIAGNOSIS — Y92410 Unspecified street and highway as the place of occurrence of the external cause: Secondary | ICD-10-CM | POA: Insufficient documentation

## 2017-08-13 DIAGNOSIS — S0990XA Unspecified injury of head, initial encounter: Secondary | ICD-10-CM | POA: Diagnosis present

## 2017-08-13 DIAGNOSIS — Z8673 Personal history of transient ischemic attack (TIA), and cerebral infarction without residual deficits: Secondary | ICD-10-CM | POA: Diagnosis not present

## 2017-08-13 DIAGNOSIS — E039 Hypothyroidism, unspecified: Secondary | ICD-10-CM | POA: Diagnosis not present

## 2017-08-13 LAB — COMPREHENSIVE METABOLIC PANEL
ALT: 14 U/L — AB (ref 17–63)
AST: 16 U/L (ref 15–41)
Albumin: 4.1 g/dL (ref 3.5–5.0)
Alkaline Phosphatase: 120 U/L (ref 38–126)
Anion gap: 7 (ref 5–15)
BUN: 17 mg/dL (ref 6–20)
CALCIUM: 9.2 mg/dL (ref 8.9–10.3)
CHLORIDE: 105 mmol/L (ref 101–111)
CO2: 27 mmol/L (ref 22–32)
Creatinine, Ser: 1.39 mg/dL — ABNORMAL HIGH (ref 0.61–1.24)
GFR, EST AFRICAN AMERICAN: 54 mL/min — AB (ref >60)
GFR, EST NON AFRICAN AMERICAN: 46 mL/min — AB (ref >60)
Glucose, Bld: 87 mg/dL (ref 65–99)
Potassium: 4.1 mmol/L (ref 3.5–5.1)
Sodium: 139 mmol/L (ref 135–145)
TOTAL PROTEIN: 7.2 g/dL (ref 6.5–8.1)
Total Bilirubin: 0.6 mg/dL (ref 0.3–1.2)

## 2017-08-13 LAB — CBC WITH DIFFERENTIAL/PLATELET
Basophils Absolute: 0 10*3/uL (ref 0.0–0.1)
Basophils Relative: 0 %
Eosinophils Absolute: 0.1 10*3/uL (ref 0.0–0.7)
Eosinophils Relative: 2 %
HCT: 42 % (ref 39.0–52.0)
Hemoglobin: 14 g/dL (ref 13.0–17.0)
LYMPHS ABS: 0.6 10*3/uL — AB (ref 0.7–4.0)
LYMPHS PCT: 11 %
MCH: 29.2 pg (ref 26.0–34.0)
MCHC: 33.3 g/dL (ref 30.0–36.0)
MCV: 87.7 fL (ref 78.0–100.0)
MONO ABS: 0.4 10*3/uL (ref 0.1–1.0)
Monocytes Relative: 7 %
Neutro Abs: 4.2 10*3/uL (ref 1.7–7.7)
Neutrophils Relative %: 80 %
PLATELETS: 200 10*3/uL (ref 150–400)
RBC: 4.79 MIL/uL (ref 4.22–5.81)
RDW: 14.8 % (ref 11.5–15.5)
WBC: 5.2 10*3/uL (ref 4.0–10.5)

## 2017-08-13 MED ORDER — LIDOCAINE-EPINEPHRINE (PF) 2 %-1:200000 IJ SOLN
10.0000 mL | Freq: Once | INTRAMUSCULAR | Status: AC
  Administered 2017-08-13: 10 mL
  Filled 2017-08-13: qty 20

## 2017-08-13 MED ORDER — POVIDONE-IODINE 10 % EX SOLN
CUTANEOUS | Status: AC
  Filled 2017-08-13: qty 15

## 2017-08-13 MED ORDER — BACITRACIN-NEOMYCIN-POLYMYXIN 400-5-5000 EX OINT
TOPICAL_OINTMENT | Freq: Once | CUTANEOUS | Status: AC
  Administered 2017-08-13: 21:00:00 via TOPICAL

## 2017-08-13 MED ORDER — BACITRACIN ZINC 500 UNIT/GM EX OINT
TOPICAL_OINTMENT | CUTANEOUS | Status: AC
  Administered 2017-08-13: 1
  Filled 2017-08-13: qty 1.8

## 2017-08-13 MED ORDER — IOPAMIDOL (ISOVUE-300) INJECTION 61%
100.0000 mL | Freq: Once | INTRAVENOUS | Status: AC | PRN
  Administered 2017-08-13: 100 mL via INTRAVENOUS

## 2017-08-13 NOTE — ED Notes (Signed)
From CT 

## 2017-08-13 NOTE — ED Notes (Signed)
Suture tray at bedside   Pt waiting lac repairs
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
## 2017-08-13 NOTE — ED Notes (Signed)
Dr Jeannie Fend at bedside  Repairing lac x 2

## 2017-08-13 NOTE — ED Notes (Signed)
Pt given paper scrubs as his clothing is filled with glass for home transport

## 2017-08-13 NOTE — ED Notes (Signed)
Lab at bedside

## 2017-08-13 NOTE — ED Notes (Signed)
Radiology at bedside

## 2017-08-13 NOTE — ED Notes (Signed)
Bacitracin x 2, 2x2 x 1, 1 pack of 4x4 with 4 in kling as wrap to L elbow wound.   Bacitracin applied to three small abrasions to pt scalp

## 2017-10-10 ENCOUNTER — Ambulatory Visit: Payer: Medicare Other | Admitting: Cardiology

## 2017-10-10 ENCOUNTER — Encounter: Payer: Self-pay | Admitting: Cardiology

## 2017-10-10 VITALS — BP 132/80 | HR 70 | Ht 70.0 in | Wt 175.0 lb

## 2017-10-10 DIAGNOSIS — I493 Ventricular premature depolarization: Secondary | ICD-10-CM

## 2017-10-10 DIAGNOSIS — I10 Essential (primary) hypertension: Secondary | ICD-10-CM

## 2017-10-10 DIAGNOSIS — I453 Trifascicular block: Secondary | ICD-10-CM

## 2017-10-10 NOTE — Patient Instructions (Addendum)
Medication Instructions:  Your physician recommends that you continue on your current medications as directed. Please refer to the Current Medication list given to you today.  If you need a refill on your cardiac medications before your next appointment, please call your pharmacy.   Labwork: None ordered  Testing/Procedures: None ordered  Follow-Up: Your physician wants you to follow-up in: 6 months with Dr. Camnitz.  You will receive a reminder letter in the mail two months in advance. If you don't receive a letter, please call our office to schedule the follow-up appointment.  Thank you for choosing [facility]!!   Macalister Arnaud, RN [phone]         
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
## 2018-04-11 ENCOUNTER — Encounter (INDEPENDENT_AMBULATORY_CARE_PROVIDER_SITE_OTHER): Payer: Self-pay

## 2018-04-11 ENCOUNTER — Ambulatory Visit: Payer: Medicare Other | Admitting: Cardiology

## 2018-04-11 ENCOUNTER — Encounter: Payer: Self-pay | Admitting: Cardiology

## 2018-04-11 VITALS — BP 160/84 | HR 67 | Ht 70.0 in | Wt 176.6 lb

## 2018-04-11 DIAGNOSIS — I453 Trifascicular block: Secondary | ICD-10-CM

## 2018-04-11 DIAGNOSIS — I10 Essential (primary) hypertension: Secondary | ICD-10-CM | POA: Diagnosis not present

## 2018-04-11 DIAGNOSIS — I493 Ventricular premature depolarization: Secondary | ICD-10-CM

## 2018-04-11 NOTE — Patient Instructions (Addendum)
Medication Instructions:  Your physician recommends that you continue on your current medications as directed. Please refer to the Current Medication list given to you today.  Labwork: None ordered  Testing/Procedures: None ordered  Follow-Up: Your physician wants you to follow-up in: 6 months with Dr. Elberta Fortis.  You will receive a reminder letter in the mail two months in advance. If you don't receive a letter, please call our office to schedule the follow-up appointment.   * If you need a refill on your cardiac medications before your next appointment, please call your pharmacy.   *Please note that any paperwork needing to be filled out by the provider will need to be addressed at the front desk prior to seeing the provider. Please note that any FMLA, disability or other documents regarding health condition is subject to a $25.00 charge that must be received prior to completion of paperwork in the form of a money order or check.  Thank you for choosing [facility]!!   Dory Horn, RN [phone]    Keep track of your blood pressure over the next several weeks.  Call the office to reports these numbers.

## 2018-04-11 NOTE — Progress Notes (Signed)
Electrophysiology Office Note   Date:  04/11/2018   ID:  Steven Krueger, DOB 1935-06-03, MRN [id number]  PCP:  Steven Banner, MD  Cardiologist:  Steven Krueger Primary Electrophysiologist:  Steven Grumbine [name], MD    Chief Complaint  Patient presents with  . Follow-up    PVC's     History of Present Illness: Steven Krueger is a 83 y.o. male who presents today for electrophysiology evaluation.   Steven Krueger is a 83 y.o. male who is being seen today for the evaluation of PVCs and bradycardia at the request of Steven Krueger, MDHistory of trifascicular block, frequent PVCs, hypertension, prior stroke, stage III chronic kidney disease.  Today, denies symptoms of palpitations, chest pain, shortness of breath, orthopnea, PND, lower extremity edema, claudication, dizziness, presyncope, syncope, bleeding, or neurologic sequela. The patient is tolerating medications without difficulties.  He has felt well.  He has had no complaints.   Past Medical History:  Diagnosis Date  . Chronic kidney disease, stage 3 (HCC) 04/01/2014  . CVA, old, hemiparesis (HCC) 04/01/2014  . Essential hypertension 04/01/2014  . Hyperlipidemia   . Hypertension    takes Prinzide daily  . Hypothyroidism    takes Synthroid daily  . Kidney stones   . Osteoarthritis of right knee 04/01/2014  . 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 Ventricular bigeminy 06/27/2014   Past Surgical History:  Procedure Laterality Date  . COLONOSCOPY WITH PROPOFOL N/A 04/09/2016   Procedure: COLONOSCOPY WITH PROPOFOL;  Surgeon: Steven Hawking, MD;  Location: [facility];  Service: Endoscopy;  Laterality: N/A;     Current Outpatient Medications  Medication Sig Dispense Refill  . allopurinol (ZYLOPRIM) 300 MG tablet TAKE ONE TABLET BY MOUTH EVERY DAY 30 tablet 5  . aspirin EC 81 MG tablet Take 1 tablet (81 mg total)  by mouth daily. 90 tablet 3  . levothyroxine (SYNTHROID, LEVOTHROID) 25 MCG tablet Take 25 mcg by mouth daily.    Marland Kitchen lisinopril-hydrochlorothiazide (PRINZIDE,ZESTORETIC) 20-12.5 MG per tablet Take 1 tablet by mouth daily.      No current facility-administered medications for this visit.     Allergies:   Patient has no known allergies.   Social History:  The patient  reports that he has quit smoking. His smokeless tobacco use includes chew. He reports that he does not drink alcohol or use drugs.   Family History:  The patient's family history includes Cancer in his father and unknown relative; Heart disease in his unknown relative; Hypertension in his unknown relative.   ROS:  Please see the history of present illness.   Otherwise, review of systems is positive for none.   All other systems are reviewed and negative.   PHYSICAL EXAM: VS:  BP (!) 160/84   Pulse 67   Ht 5\' 10"  (1.778 m)   Wt 176 lb 9.6 oz (80.1 kg)   SpO2 95%   BMI 25.34 kg/m  , BMI Body mass index is 25.34 kg/m. GEN: Well nourished, well developed, in no acute distress  HEENT: normal  Neck: no JVD, carotid bruits, or masses Cardiac: RRR; no murmurs, rubs, or gallops,no edema  Respiratory:  clear to auscultation bilaterally, normal work of breathing GI: soft, nontender, nondistended, + BS MS: no deformity or atrophy  Skin: warm and dry Neuro:  Strength and sensation are intact Psych: euthymic mood, full affect  EKG:  EKG is ordered today. Personal review of the ekg ordered shows rhythm, first-degree AV block, left anterior fascicular block, right bundle branch block, PVCs  Recent Labs: 08/13/2017: ALT 14; BUN 17; Creatinine, Ser 1.39; Hemoglobin 14.0; Platelets 200; Potassium 4.1; Sodium 139    Lipid Panel     Component Value Date/Time   CHOL  10/30/2009 0604    143        ATP III CLASSIFICATION:  <200     mg/dL   Desirable  197-588  mg/dL   Borderline High  >=325    mg/dL   High          TRIG 73  10/30/2009 0604   HDL 24 (L) 10/30/2009 0604   CHOLHDL 6.0 10/30/2009 0604   VLDL 15 10/30/2009 0604   LDLCALC (H) 10/30/2009 0604    104        Total Cholesterol/HDL:CHD Risk Coronary Heart Disease Risk Table                     Men   Women  1/2 Average Risk   3.4   3.3  Average Risk       5.0   4.4  2 X Average Risk   9.6   7.1  3 X Average Risk  23.4   11.0        Use the calculated Patient Ratio above and the CHD Risk Table to determine the patient's CHD Risk.        ATP III CLASSIFICATION (LDL):  <100     mg/dL   Optimal  498-264  mg/dL   Near or Above                    Optimal  130-159  mg/dL   Borderline  158-309  mg/dL   High  >407     mg/dL   Very High     Wt Readings from Last 3 Encounters:  04/11/18 176 lb 9.6 oz (80.1 kg)  10/10/17 175 lb (79.4 kg)  04/19/17 175 lb (79.4 kg)      Other studies Reviewed: Additional studies/ records that were reviewed today include: TTE 01/14/17  Review of the above records today demonstrates:  - Left ventricle: The cavity size was normal. Wall thickness was   normal. Systolic function was normal. The estimated ejection   fraction was in the range of 55% to 60%. Wall motion was normal;   there were no regional wall motion abnormalities. Doppler   parameters are consistent with abnormal left ventricular   relaxation (grade 1 diastolic dysfunction). - Aortic valve: There was mild regurgitation. - Aorta: Aortic root dimension: 39 mm (ED). - Ascending aorta: The ascending aorta was mildly dilated.  Holter 12/04/15  Normal sinus rhythm with heart rate range 53-135 bpm with average heart rate 84 bpm  Frequent predominantly isolated premature ventricular contractions (13% of all activity)  Brief nonsustained VT lasting up to 8 beats.  Rare PACs  First-degree AV block with intraventricular conduction abnormality   Significant atrioventricular and intraventricular conduction abnormality. Frequent PVCs with rare episodes of  non-sustained WCT (less than 8 beats) Basic rhythm is sinus rhythm with first-degree AV block.  ASSESSMENT AND PLAN:  1.  Trifascicular block: Feeling well without major complaint.  He has not had episodes of dizziness fatigue, or near syncope.  Currently no indication for pacemaker.  2. Hypertension: Evaded today.  His blood pressure has been normal previously.  Have asked  him to check it at home over the next 2 weeks and we Myanna Ziesmer touch base then.  3. PVCs: 1 13% on his monitor.  PVCs appear to be coming from the left ventricle.  He is asymptomatic.  May require repeat echo in 1 year.    Current medicines are reviewed at length with the patient today.   The patient does not have concerns regarding his medicines.  The following changes were made today:  none  Labs/ tests ordered today include:  Orders Placed This Encounter  Procedures  . EKG 12-Lead     Disposition:   FU with Panda Crossin 6 months  Signed, Zenia Guest [name], MD  04/11/2018 11:22 AM     [facility] [address] [place] [address] [phone] (office) [phone] (fax)

## 2018-04-17 ENCOUNTER — Encounter (HOSPITAL_COMMUNITY): Payer: Self-pay | Admitting: Emergency Medicine

## 2018-04-17 ENCOUNTER — Emergency Department (HOSPITAL_COMMUNITY)
Admission: EM | Admit: 2018-04-17 | Discharge: 2018-04-17 | Disposition: A | Discharge status: Home / Self Care | Payer: Medicare Other | Attending: Emergency Medicine | Admitting: Emergency Medicine

## 2018-04-17 ENCOUNTER — Other Ambulatory Visit: Payer: Self-pay

## 2018-04-17 ENCOUNTER — Emergency Department (HOSPITAL_COMMUNITY): Payer: Medicare Other

## 2018-04-17 DIAGNOSIS — R61 Generalized hyperhidrosis: Secondary | ICD-10-CM | POA: Diagnosis not present

## 2018-04-17 DIAGNOSIS — R55 Syncope and collapse: Secondary | ICD-10-CM | POA: Diagnosis present

## 2018-04-17 DIAGNOSIS — Z7982 Long term (current) use of aspirin: Secondary | ICD-10-CM | POA: Diagnosis not present

## 2018-04-17 DIAGNOSIS — I129 Hypertensive chronic kidney disease with stage 1 through stage 4 chronic kidney disease, or unspecified chronic kidney disease: Secondary | ICD-10-CM | POA: Diagnosis not present

## 2018-04-17 DIAGNOSIS — E039 Hypothyroidism, unspecified: Secondary | ICD-10-CM | POA: Diagnosis not present

## 2018-04-17 DIAGNOSIS — F17220 Nicotine dependence, chewing tobacco, uncomplicated: Secondary | ICD-10-CM | POA: Diagnosis not present

## 2018-04-17 DIAGNOSIS — N183 Chronic kidney disease, stage 3 (moderate): Secondary | ICD-10-CM | POA: Diagnosis not present

## 2018-04-17 DIAGNOSIS — Z79899 Other long term (current) drug therapy: Secondary | ICD-10-CM | POA: Diagnosis not present

## 2018-04-17 LAB — CBG MONITORING, ED: Glucose-Capillary: 108 mg/dL — ABNORMAL HIGH (ref 70–99)

## 2018-04-17 LAB — CBC WITH DIFFERENTIAL/PLATELET
BASOS ABS: 0 10*3/uL (ref 0.0–0.1)
Basophils Relative: 0 %
EOS PCT: 4 %
Eosinophils Absolute: 0.2 10*3/uL (ref 0.0–0.7)
HCT: 39.7 % (ref 39.0–52.0)
HEMOGLOBIN: 13.2 g/dL (ref 13.0–17.0)
Lymphocytes Relative: 23 %
Lymphs Abs: 1.3 10*3/uL (ref 0.7–4.0)
MCH: 29.3 pg (ref 26.0–34.0)
MCHC: 33.2 g/dL (ref 30.0–36.0)
MCV: 88.2 fL (ref 78.0–100.0)
Monocytes Absolute: 0.4 10*3/uL (ref 0.1–1.0)
Monocytes Relative: 8 %
NEUTROS PCT: 65 %
Neutro Abs: 3.7 10*3/uL (ref 1.7–7.7)
PLATELETS: 187 10*3/uL (ref 150–400)
RBC: 4.5 MIL/uL (ref 4.22–5.81)
RDW: 14.8 % (ref 11.5–15.5)
WBC: 5.7 10*3/uL (ref 4.0–10.5)

## 2018-04-17 LAB — BASIC METABOLIC PANEL
ANION GAP: 8 (ref 5–15)
BUN: 22 mg/dL (ref 8–23)
CHLORIDE: 108 mmol/L (ref 98–111)
CO2: 24 mmol/L (ref 22–32)
Calcium: 8.8 mg/dL — ABNORMAL LOW (ref 8.9–10.3)
Creatinine, Ser: 1.62 mg/dL — ABNORMAL HIGH (ref 0.61–1.24)
GFR, EST AFRICAN AMERICAN: 44 mL/min — AB (ref >60)
GFR, EST NON AFRICAN AMERICAN: 38 mL/min — AB (ref >60)
Glucose, Bld: 108 mg/dL — ABNORMAL HIGH (ref 70–99)
POTASSIUM: 3.7 mmol/L (ref 3.5–5.1)
Sodium: 140 mmol/L (ref 135–145)

## 2018-04-17 LAB — I-STAT TROPONIN, ED
Troponin i, poc: 0.01 ng/mL (ref 0.00–0.08)
Troponin i, poc: 0.01 ng/mL (ref 0.00–0.08)

## 2018-04-17 MED ORDER — SODIUM CHLORIDE 0.9 % IV BOLUS
1000.0000 mL | Freq: Once | INTRAVENOUS | Status: AC
  Administered 2018-04-17: 1000 mL via INTRAVENOUS

## 2018-04-17 NOTE — ED Provider Notes (Signed)
[facility] EMERGENCY DEPARTMENT Provider Note   CSN: [id number] Arrival date & time: 04/17/18  0818     History   Chief Complaint Chief Complaint  Patient presents with  . Loss of Consciousness    HPI Steven Krueger is a 83 y.o. male.  He was here in the emergency department visiting a patient when while sitting became diaphoretic and unresponsive.  There was no fall.  He was moved over to the critical care room where his initial blood sugar was 110.  He is becoming more alert here and denies any pain.  He is not sure what happened.  He denies any current complaints but he says he has not slept well in the last 2 days because his wife has had troubles with low blood sugars.  He denies any prior history of syncope.  The history is provided by the patient and the spouse.  Loss of Consciousness   This is a new problem. Episode onset: just now. The problem has been resolved. He lost consciousness for a period of less than one minute. Associated with: sitting in chair. Associated symptoms include diaphoresis. Pertinent negatives include abdominal pain, back pain, bowel incontinence, chest pain, fever, headaches, malaise/fatigue, nausea, seizures, slurred speech, visual change and vomiting. The treatment provided significant relief. His past medical history is significant for CVA and HTN.    Past Medical History:  Diagnosis Date  . Chronic kidney disease, stage 3 (HCC) 04/01/2014  . CVA, old, hemiparesis (HCC) 04/01/2014  . Essential hypertension 04/01/2014  . Hyperlipidemia   . Hypertension    takes Prinzide daily  . Hypothyroidism    takes Synthroid daily  . Kidney stones   . Osteoarthritis of right knee 04/01/2014  . 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 Ventricular bigeminy 06/27/2014    Patient Active Problem List   Diagnosis Date Noted  .  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  . Bradycardia 06/27/2014  . Bigeminy 06/27/2014  . Essential hypertension 04/01/2014  . Hypothyroidism 04/01/2014  . CVA, old, hemiparesis (HCC) 04/01/2014  . Chronic kidney disease, stage 3 (HCC) 04/01/2014  . Osteoarthritis of right knee 04/01/2014    Past Surgical History:  Procedure Laterality Date  . COLONOSCOPY WITH PROPOFOL N/A 04/09/2016   Procedure: COLONOSCOPY WITH PROPOFOL;  Surgeon: [name], MD;  Location: [facility];  Service: Endoscopy;  Laterality: N/A;        Home Medications    Prior to Admission medications   Medication Sig Start Date End Date Taking? Authorizing Provider  allopurinol (ZYLOPRIM) 300 MG tablet TAKE ONE TABLET BY MOUTH EVERY DAY 06/02/17   Darreld Mclean, MD  aspirin EC 81 MG tablet Take 1 tablet (81 mg total) by mouth daily. 04/19/17   Camnitz, Andree Coss, MD  levothyroxine (SYNTHROID, LEVOTHROID) 25 MCG tablet Take 25 mcg by mouth daily.    [provider]  lisinopril-hydrochlorothiazide (PRINZIDE,ZESTORETIC) 20-12.5 MG per tablet Take 1 tablet by mouth daily.  03/12/14   [provider]    Family History Family History  Problem Relation Age of Onset  . Cancer Father   . Heart disease Unknown   . Hypertension Unknown   . Cancer Unknown     Social History Social History   Tobacco Use  . Smoking status: Former Games developer  . Smokeless tobacco: Current User    Types: Chew  Substance Use Topics  .  Alcohol use: No  . Drug use: No     Allergies   Patient has no known allergies.   Review of Systems Review of Systems  Constitutional: Positive for diaphoresis. Negative for fever and malaise/fatigue.  HENT: Negative for sore throat.   Eyes: Negative for visual disturbance.  Respiratory: Negative for shortness of breath.   Cardiovascular: Positive for syncope. Negative for chest pain.  Gastrointestinal: Negative for abdominal pain, bowel incontinence, nausea and vomiting.    Genitourinary: Negative for dysuria.  Musculoskeletal: Negative for back pain.  Skin: Negative for rash.  Neurological: Negative for seizures and headaches.     Physical Exam Updated Vital Signs Ht 5\' 10"  (1.778 m)   Wt 80.3 kg (177 lb)   BMI 25.40 kg/m   Physical Exam  Constitutional: He is oriented to person, place, and time. He appears well-developed and well-nourished. No distress.  HENT:  Head: Normocephalic and atraumatic.  Right Ear: External ear normal.  Left Ear: External ear normal.  Nose: Nose normal.  Mouth/Throat: Oropharynx is clear and moist.  Eyes: Pupils are equal, round, and reactive to light. EOM are normal.  Neck: Normal range of motion. Neck supple.  Cardiovascular: Regular rhythm, normal heart sounds and intact distal pulses. Bradycardia present.  Pulmonary/Chest: Effort normal. No stridor. No respiratory distress. He has no wheezes. He has no rales.  Abdominal: Soft. He exhibits no mass. There is no tenderness. There is no guarding.  Musculoskeletal: Normal range of motion. He exhibits no tenderness or deformity.  Neurological: He is alert and oriented to person, place, and time. No cranial nerve deficit. GCS eye subscore is 4. GCS verbal subscore is 5. GCS motor subscore is 6.  Skin: Skin is warm. Capillary refill takes less than 2 seconds. He is diaphoretic.     ED Treatments / Results  Labs (all labs ordered are listed, but only abnormal results are displayed) Labs Reviewed  BASIC METABOLIC PANEL - Abnormal; Notable for the following components:      Result Value   Glucose, Bld 108 (*)    Creatinine, Ser 1.62 (*)    Calcium 8.8 (*)    GFR calc non Af Amer 38 (*)    GFR calc Af Amer 44 (*)    All other components within normal limits  CBG MONITORING, ED - Abnormal; Notable for the following components:   Glucose-Capillary 108 (*)    All other components within normal limits  CBC WITH DIFFERENTIAL/PLATELET  I-STAT TROPONIN, ED  I-STAT  TROPONIN, ED    EKG EKG Interpretation  Date/Time:  Monday April 17 2018 08:21:44 EDT Ventricular Rate:  56 PR Interval:    QRS Duration: 176 QT Interval:  500 QTC Calculation: 483 R Axis:   -85 Text Interpretation:  Sinus rhythm Prolonged PR interval RBBB and LAFB similar to prior today Confirmed by Meridee Score [phone]) on 04/17/2018 8:24:30 AM   Radiology Dg Chest Port 1 View  Result Date: 04/17/2018 CLINICAL DATA:  Syncope. EXAM: PORTABLE CHEST 1 VIEW COMPARISON:  Radiograph of August 13, 2017. FINDINGS: The heart size and mediastinal contours are within normal limits. Both lungs are clear. Hypoinflation of the lungs is noted. No pneumothorax or pleural effusion is noted. The visualized skeletal structures are unremarkable. IMPRESSION: Hypoinflation of the lungs.  No other abnormality seen in the chest. Electronically Signed   By: Lupita Raider, M.D.   On: 04/17/2018 08:43    Procedures Procedures (including critical care time)  Medications Ordered in ED Medications  sodium chloride 0.9 % bolus 1,000 mL (has no administration in time range)     Initial Impression / Assessment and Plan / ED Course  I have reviewed the triage vital signs and the nursing notes.  Pertinent labs & imaging results that were available during my care of the patient were reviewed by me and considered in my medical decision making (see chart for details).  Clinical Course as of Apr 18 1029  Mon Apr 17, 2018  0907 Patient is currently now eating and blood pressure looking better after some IV fluids.  Labs are starting to come back.   [MB]  1233 Patient had orthostatics done and look okay.  He feels back to baseline.   [MB]    Clinical Course User Index [MB] Terrilee Files, MD     Final Clinical Impressions(s) / ED Diagnoses   Final diagnoses:  Syncope and collapse    ED Discharge Orders    None       Terrilee Files, MD 04/18/18 1031

## 2018-04-17 NOTE — Discharge Instructions (Addendum)
You are evaluated in the emergency department after a fainting spell while you were here with your wife.  Your lab work and EKG and chest x-ray are all look unremarkable.  You have eaten here and your blood pressure has remained stable.  Please stay well-hydrated and follow-up with your doctor and return if any worsening symptoms.

## 2018-04-17 NOTE — ED Triage Notes (Signed)
Pt was found altered in family members room.  Pt diaphoretic and unable to answer questions CBG 108

## 2018-04-21 ENCOUNTER — Telehealth: Payer: Self-pay | Admitting: *Deleted

## 2018-04-21 NOTE — Telephone Encounter (Signed)
Spoke to pt and his wife. Wife informs me last 2 readings were 128/79, 135/82. They will continue to monitor and call PCP office to discuss if BP averages greater than 130/90s.

## 2018-10-03 ENCOUNTER — Encounter: Payer: Self-pay | Admitting: Cardiology

## 2018-10-03 ENCOUNTER — Ambulatory Visit: Payer: Medicare Other | Admitting: Cardiology

## 2018-10-03 VITALS — BP 132/70 | HR 65 | Ht 70.0 in | Wt 179.0 lb

## 2018-10-03 DIAGNOSIS — I493 Ventricular premature depolarization: Secondary | ICD-10-CM

## 2018-10-03 DIAGNOSIS — I453 Trifascicular block: Secondary | ICD-10-CM

## 2018-10-03 DIAGNOSIS — I10 Essential (primary) hypertension: Secondary | ICD-10-CM | POA: Diagnosis not present

## 2018-10-03 NOTE — Patient Instructions (Signed)
Medication Instructions:  Your physician recommends that you continue on your current medications as directed. Please refer to the Current Medication list given to you today.  If you need a refill on your cardiac medications before your next appointment, please call your pharmacy.   Labwork: None ordered  Testing/Procedures: None ordered  Follow-Up: Your physician wants you to follow-up in: 6 months with Dr. Camnitz.  You will receive a reminder letter in the mail two months in advance. If you don't receive a letter, please call our office to schedule the follow-up appointment.  Thank you for choosing [facility]!!   Anahi Belmar, RN [phone]         

## 2018-10-03 NOTE — Progress Notes (Signed)
Electrophysiology Office Note   Date:  10/03/2018   ID:  Steven Krueger, DOB 1935/01/21, MRN [id number]  PCP:  Richmond Campbell., PA-C  Cardiologist:  Katrinka Blazing Primary Electrophysiologist:  Abanoub Hanken [name], MD    No chief complaint on file.    History of Present Illness: Steven Krueger is a 83 y.o. male who presents today for electrophysiology evaluation.   Steven Krueger is a 83 y.o. male who is being seen today for the evaluation of PVCs and bradycardia at the request of [name], MD History of trifascicular block, frequent PVCs, hypertension, prior stroke, stage III chronic kidney disease.  Today, denies symptoms of palpitations, chest pain, shortness of breath, orthopnea, PND, lower extremity edema, claudication, dizziness, presyncope, syncope, bleeding, or neurologic sequela. The patient is tolerating medications without difficulties.  He is currently feeling well.  He has no chest pain or shortness of breath.  He is able to do all of his daily activities without restriction.   Past Medical History:  Diagnosis Date  . Chronic kidney disease, stage 3 (HCC) 04/01/2014  . CVA, old, hemiparesis (HCC) 04/01/2014  . Essential hypertension 04/01/2014  . Hyperlipidemia   . Hypertension    takes Prinzide daily  . Hypothyroidism    takes Synthroid daily  . Kidney stones   . Osteoarthritis of right knee 04/01/2014  . 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 Ventricular bigeminy 06/27/2014   Past Surgical History:  Procedure Laterality Date  . COLONOSCOPY WITH PROPOFOL N/A 04/09/2016   Procedure: COLONOSCOPY WITH PROPOFOL;  Surgeon: [name], MD;  Location: [facility];  Service: Endoscopy;  Laterality: N/A;     Current Outpatient Medications  Medication Sig Dispense Refill  . allopurinol (ZYLOPRIM) 300 MG tablet TAKE ONE TABLET BY MOUTH EVERY DAY  30 tablet 5  . aspirin EC 81 MG tablet Take 1 tablet (81 mg total) by mouth daily. 90 tablet 3  . levothyroxine (SYNTHROID, LEVOTHROID) 25 MCG tablet Take 25 mcg by mouth daily.    Marland Kitchen lisinopril-hydrochlorothiazide (PRINZIDE,ZESTORETIC) 20-12.5 MG per tablet Take 1 tablet by mouth daily.     . Cholecalciferol (VITAMIN D3) 10 MCG (400 UNIT) CHEW Chew 10 mcg by mouth daily.     No current facility-administered medications for this visit.     Allergies:   Patient has no known allergies.   Social History:  The patient  reports that he has quit smoking. His smokeless tobacco use includes chew. He reports that he does not drink alcohol or use drugs.   Family History:  The patient's family history includes Cancer in his father and unknown relative; Heart disease in his unknown relative; Hypertension in his unknown relative.   ROS:  Please see the history of present illness.   Otherwise, review of systems is positive for none.   All other systems are reviewed and negative.   PHYSICAL EXAM: VS:  BP 132/70   Pulse 65   Ht 5\' 10"  (1.778 m)   Wt 179 lb (81.2 kg)   BMI 25.68 kg/m  , BMI Body mass index is 25.68 kg/m. GEN: Well nourished, well developed, in no acute distress  HEENT: normal  Neck: no JVD, carotid bruits, or masses Cardiac: RRR; no murmurs, rubs, or gallops,no edema  Respiratory:  clear to auscultation bilaterally, normal work of breathing GI: soft, nontender, nondistended, + BS MS:  no deformity or atrophy  Skin: warm and dry Neuro:  Strength and sensation are intact Psych: euthymic mood, full affect  EKG:  EKG is ordered today. Personal review of the ekg ordered shows SR, RBBB, LAFB, 1dAVB   Recent Labs: 04/17/2018: BUN 22; Creatinine, Ser 1.62; Hemoglobin 13.2; Platelets 187; Potassium 3.7; Sodium 140    Lipid Panel     Component Value Date/Time   CHOL  10/30/2009 0604    143        ATP III CLASSIFICATION:  <200     mg/dL   Desirable  909-311  mg/dL   Borderline  High  >=216    mg/dL   High          TRIG 73 10/30/2009 0604   HDL 24 (L) 10/30/2009 0604   CHOLHDL 6.0 10/30/2009 0604   VLDL 15 10/30/2009 0604   LDLCALC (H) 10/30/2009 0604    104        Total Cholesterol/HDL:CHD Risk Coronary Heart Disease Risk Table                     Men   Women  1/2 Average Risk   3.4   3.3  Average Risk       5.0   4.4  2 X Average Risk   9.6   7.1  3 X Average Risk  23.4   11.0        Use the calculated Patient Ratio above and the CHD Risk Table to determine the patient's CHD Risk.        ATP III CLASSIFICATION (LDL):  <100     mg/dL   Optimal  244-695  mg/dL   Near or Above                    Optimal  130-159  mg/dL   Borderline  072-257  mg/dL   High  >505     mg/dL   Very High     Wt Readings from Last 3 Encounters:  10/03/18 179 lb (81.2 kg)  04/17/18 177 lb (80.3 kg)  04/11/18 176 lb 9.6 oz (80.1 kg)      Other studies Reviewed: Additional studies/ records that were reviewed today include: TTE 01/14/17  Review of the above records today demonstrates:  - Left ventricle: The cavity size was normal. Wall thickness was   normal. Systolic function was normal. The estimated ejection   fraction was in the range of 55% to 60%. Wall motion was normal;   there were no regional wall motion abnormalities. Doppler   parameters are consistent with abnormal left ventricular   relaxation (grade 1 diastolic dysfunction). - Aortic valve: There was mild regurgitation. - Aorta: Aortic root dimension: 39 mm (ED). - Ascending aorta: The ascending aorta was mildly dilated.  Holter 12/04/15  Normal sinus rhythm with heart rate range 53-135 bpm with average heart rate 84 bpm  Frequent predominantly isolated premature ventricular contractions (13% of all activity)  Brief nonsustained VT lasting up to 8 beats.  Rare PACs  First-degree AV block with intraventricular conduction abnormality   Significant atrioventricular and intraventricular conduction  abnormality. Frequent PVCs with rare episodes of non-sustained WCT (less than 8 beats) Basic rhythm is sinus rhythm with first-degree AV block.  ASSESSMENT AND PLAN:  1.  Trifascicular block: Feeling well without major complaint.  No weakness, fatigue, or dizziness.  No episodes of cardiac sounding syncope.  I have told him that if he has  any episodes, to call us and we Lasalle Abee likely need a pacemaker.  He should avoid all AV nodal blockers.  2. Hypertension: Well-controlled.  No changes.  3. PVCs: 13% found on cardiac monitor.  Appear to be coming from the left ventricle.  He is minimally symptomatic.  May get an echo at his next visit.    Current medicines are reviewed at length with the patient today.   The patient does not have concerns regarding his medicines.  The following changes were made today: None  Labs/ tests ordered today include:  Orders Placed This Encounter  Procedures  . EKG 12-Lead     Disposition:   FU with Atheena Spano 6 months  Signed, Josphine Laffey [name], MD  10/03/2018 10:52 AM     [facility] [address] [place] [address] [phone] (office) [phone] (fax)

## 2019-07-26 ENCOUNTER — Inpatient Hospital Stay (HOSPITAL_COMMUNITY)
Admission: EM | Admit: 2019-07-26 | Discharge: 2019-07-28 | DRG: 243 | Disposition: A | Discharge status: Home / Self Care | Payer: Medicare HMO | Source: Ambulatory Visit | Attending: Cardiology | Admitting: Cardiology

## 2019-07-26 ENCOUNTER — Encounter (HOSPITAL_COMMUNITY): Payer: Self-pay | Admitting: Cardiology

## 2019-07-26 ENCOUNTER — Emergency Department (HOSPITAL_COMMUNITY): Payer: Medicare HMO

## 2019-07-26 ENCOUNTER — Encounter (HOSPITAL_COMMUNITY)
Admission: EM | Disposition: A | Discharge status: Home / Self Care | Payer: Self-pay | Source: Ambulatory Visit | Attending: Cardiology

## 2019-07-26 ENCOUNTER — Other Ambulatory Visit: Payer: Self-pay

## 2019-07-26 DIAGNOSIS — Z23 Encounter for immunization: Secondary | ICD-10-CM

## 2019-07-26 DIAGNOSIS — I252 Old myocardial infarction: Secondary | ICD-10-CM

## 2019-07-26 DIAGNOSIS — I69359 Hemiplegia and hemiparesis following cerebral infarction affecting unspecified side: Secondary | ICD-10-CM | POA: Diagnosis not present

## 2019-07-26 DIAGNOSIS — I442 Atrioventricular block, complete: Secondary | ICD-10-CM | POA: Diagnosis present

## 2019-07-26 DIAGNOSIS — M1711 Unilateral primary osteoarthritis, right knee: Secondary | ICD-10-CM | POA: Diagnosis present

## 2019-07-26 DIAGNOSIS — R9431 Abnormal electrocardiogram [ECG] [EKG]: Secondary | ICD-10-CM | POA: Diagnosis not present

## 2019-07-26 DIAGNOSIS — F17220 Nicotine dependence, chewing tobacco, uncomplicated: Secondary | ICD-10-CM | POA: Diagnosis present

## 2019-07-26 DIAGNOSIS — E039 Hypothyroidism, unspecified: Secondary | ICD-10-CM | POA: Diagnosis present

## 2019-07-26 DIAGNOSIS — R001 Bradycardia, unspecified: Secondary | ICD-10-CM | POA: Diagnosis present

## 2019-07-26 DIAGNOSIS — Z79899 Other long term (current) drug therapy: Secondary | ICD-10-CM

## 2019-07-26 DIAGNOSIS — Z79890 Hormone replacement therapy (postmenopausal): Secondary | ICD-10-CM

## 2019-07-26 DIAGNOSIS — I129 Hypertensive chronic kidney disease with stage 1 through stage 4 chronic kidney disease, or unspecified chronic kidney disease: Secondary | ICD-10-CM | POA: Diagnosis present

## 2019-07-26 DIAGNOSIS — Z7982 Long term (current) use of aspirin: Secondary | ICD-10-CM

## 2019-07-26 DIAGNOSIS — I77810 Thoracic aortic ectasia: Secondary | ICD-10-CM | POA: Diagnosis present

## 2019-07-26 DIAGNOSIS — Z9049 Acquired absence of other specified parts of digestive tract: Secondary | ICD-10-CM | POA: Diagnosis not present

## 2019-07-26 DIAGNOSIS — Z950 Presence of cardiac pacemaker: Secondary | ICD-10-CM

## 2019-07-26 DIAGNOSIS — R55 Syncope and collapse: Secondary | ICD-10-CM

## 2019-07-26 DIAGNOSIS — N183 Chronic kidney disease, stage 3 unspecified: Secondary | ICD-10-CM | POA: Diagnosis present

## 2019-07-26 DIAGNOSIS — I10 Essential (primary) hypertension: Secondary | ICD-10-CM | POA: Diagnosis present

## 2019-07-26 DIAGNOSIS — Z20828 Contact with and (suspected) exposure to other viral communicable diseases: Secondary | ICD-10-CM | POA: Diagnosis present

## 2019-07-26 DIAGNOSIS — Z8249 Family history of ischemic heart disease and other diseases of the circulatory system: Secondary | ICD-10-CM | POA: Diagnosis not present

## 2019-07-26 DIAGNOSIS — I453 Trifascicular block: Secondary | ICD-10-CM | POA: Diagnosis present

## 2019-07-26 HISTORY — PX: TEMPORARY PACEMAKER: CATH118268

## 2019-07-26 HISTORY — DX: Atrioventricular block, complete: I44.2

## 2019-07-26 LAB — BASIC METABOLIC PANEL
Anion gap: 9 (ref 5–15)
BUN: 26 mg/dL — ABNORMAL HIGH (ref 8–23)
CO2: 21 mmol/L — ABNORMAL LOW (ref 22–32)
Calcium: 8.7 mg/dL — ABNORMAL LOW (ref 8.9–10.3)
Chloride: 107 mmol/L (ref 98–111)
Creatinine, Ser: 1.75 mg/dL — ABNORMAL HIGH (ref 0.61–1.24)
GFR calc Af Amer: 41 mL/min — ABNORMAL LOW (ref >60)
GFR calc non Af Amer: 35 mL/min — ABNORMAL LOW (ref >60)
Glucose, Bld: 83 mg/dL (ref 70–99)
Potassium: 4.5 mmol/L (ref 3.5–5.1)
Sodium: 137 mmol/L (ref 135–145)

## 2019-07-26 LAB — CBC
HCT: 39.6 % (ref 39.0–52.0)
HCT: 41.6 % (ref 39.0–52.0)
Hemoglobin: 12.9 g/dL — ABNORMAL LOW (ref 13.0–17.0)
Hemoglobin: 14.1 g/dL (ref 13.0–17.0)
MCH: 29.8 pg (ref 26.0–34.0)
MCH: 30.1 pg (ref 26.0–34.0)
MCHC: 32.6 g/dL (ref 30.0–36.0)
MCHC: 33.9 g/dL (ref 30.0–36.0)
MCV: 91.5 fL (ref 80.0–100.0)
MCV: 88.7 fL (ref 80.0–100.0)
Platelets: 202 10*3/uL (ref 150–400)
Platelets: 192 10*3/uL (ref 150–400)
RBC: 4.33 MIL/uL (ref 4.22–5.81)
RBC: 4.69 MIL/uL (ref 4.22–5.81)
RDW: 14.9 % (ref 11.5–15.5)
RDW: 15 % (ref 11.5–15.5)
WBC: 4.8 10*3/uL (ref 4.0–10.5)
WBC: 4.8 10*3/uL (ref 4.0–10.5)
nRBC: 0 % (ref 0.0–0.2)
nRBC: 0 % (ref 0.0–0.2)

## 2019-07-26 LAB — T4, FREE: Free T4: 1.14 ng/dL — ABNORMAL HIGH (ref 0.61–1.12)

## 2019-07-26 LAB — MRSA PCR SCREENING: MRSA by PCR: NEGATIVE

## 2019-07-26 LAB — TROPONIN I (HIGH SENSITIVITY)
Troponin I (High Sensitivity): 17 ng/L (ref <18)
Troponin I (High Sensitivity): 29 ng/L — ABNORMAL HIGH (ref <18)
Troponin I (High Sensitivity): 28 ng/L — ABNORMAL HIGH (ref <18)

## 2019-07-26 LAB — TSH: TSH: 1.876 u[IU]/mL (ref 0.350–4.500)

## 2019-07-26 LAB — SARS CORONAVIRUS 2 BY RT PCR (HOSPITAL ORDER, PERFORMED IN ~~LOC~~ HOSPITAL LAB): SARS Coronavirus 2: NEGATIVE

## 2019-07-26 LAB — CREATININE, SERUM
Creatinine, Ser: 1.66 mg/dL — ABNORMAL HIGH (ref 0.61–1.24)
GFR calc Af Amer: 44 mL/min — ABNORMAL LOW (ref >60)
GFR calc non Af Amer: 38 mL/min — ABNORMAL LOW (ref >60)

## 2019-07-26 SURGERY — TEMPORARY PACEMAKER
Anesthesia: LOCAL

## 2019-07-26 MED ORDER — SODIUM CHLORIDE 0.9 % IV SOLN
INTRAVENOUS | Status: AC | PRN
  Administered 2019-07-26: 10 mL/h via INTRAVENOUS

## 2019-07-26 MED ORDER — ONDANSETRON HCL 4 MG/2ML IJ SOLN: 4.0000 mg | Freq: Four times a day (QID) | INTRAMUSCULAR | Status: DC | PRN

## 2019-07-26 MED ORDER — INFLUENZA VAC A&B SA ADJ QUAD 0.5 ML IM PRSY
0.5000 mL | PREFILLED_SYRINGE | INTRAMUSCULAR | Status: AC
  Administered 2019-07-27: 0.5 mL via INTRAMUSCULAR
  Filled 2019-07-26 (×2): qty 0.5

## 2019-07-26 MED ORDER — CHLORHEXIDINE GLUCONATE CLOTH 2 % EX PADS
6.0000 | MEDICATED_PAD | Freq: Every day | CUTANEOUS | Status: DC
  Administered 2019-07-27 – 2019-07-28 (×2): 6 via TOPICAL

## 2019-07-26 MED ORDER — ATROPINE SULFATE 1 MG/10ML IJ SOSY
PREFILLED_SYRINGE | INTRAMUSCULAR | Status: AC
  Administered 2019-07-26: 0.5 mg
  Filled 2019-07-26: qty 10

## 2019-07-26 MED ORDER — HEPARIN (PORCINE) IN NACL 1000-0.9 UT/500ML-% IV SOLN
INTRAVENOUS | Status: DC | PRN
  Administered 2019-07-26: 500 mL

## 2019-07-26 MED ORDER — SODIUM CHLORIDE 0.9 % IV SOLN
250.0000 mL | INTRAVENOUS | Status: DC | PRN
  Administered 2019-07-26: 250 mL via INTRAVENOUS

## 2019-07-26 MED ORDER — NITROGLYCERIN 0.4 MG SL SUBL: 0.4000 mg | SUBLINGUAL_TABLET | SUBLINGUAL | Status: DC | PRN

## 2019-07-26 MED ORDER — ACETAMINOPHEN 325 MG PO TABS: 650.0000 mg | ORAL_TABLET | ORAL | Status: DC | PRN

## 2019-07-26 MED ORDER — HEPARIN (PORCINE) IN NACL 1000-0.9 UT/500ML-% IV SOLN
INTRAVENOUS | Status: AC
  Filled 2019-07-26: qty 500

## 2019-07-26 MED ORDER — ASPIRIN EC 81 MG PO TBEC
81.0000 mg | DELAYED_RELEASE_TABLET | Freq: Every day | ORAL | Status: DC
  Administered 2019-07-27 – 2019-07-28 (×2): 81 mg via ORAL
  Filled 2019-07-26 (×3): qty 1

## 2019-07-26 MED ORDER — HEPARIN SODIUM (PORCINE) 5000 UNIT/ML IJ SOLN
5000.0000 [IU] | Freq: Three times a day (TID) | INTRAMUSCULAR | Status: DC
  Administered 2019-07-26 – 2019-07-28 (×3): 5000 [IU] via SUBCUTANEOUS
  Filled 2019-07-26 (×3): qty 1

## 2019-07-26 MED ORDER — SODIUM CHLORIDE 0.9% FLUSH
3.0000 mL | Freq: Two times a day (BID) | INTRAVENOUS | Status: DC
  Administered 2019-07-26 – 2019-07-28 (×3): 3 mL via INTRAVENOUS

## 2019-07-26 MED ORDER — LIDOCAINE HCL (PF) 1 % IJ SOLN
INTRAMUSCULAR | Status: DC | PRN
  Administered 2019-07-26: 15 mL

## 2019-07-26 MED ORDER — LEVOTHYROXINE SODIUM 25 MCG PO TABS
25.0000 ug | ORAL_TABLET | Freq: Every day | ORAL | Status: DC
  Administered 2019-07-27 – 2019-07-28 (×2): 25 ug via ORAL
  Filled 2019-07-26 (×2): qty 1

## 2019-07-26 MED ORDER — SODIUM CHLORIDE 0.9% FLUSH: 3.0000 mL | INTRAVENOUS | Status: DC | PRN

## 2019-07-26 MED ORDER — LIDOCAINE HCL (PF) 1 % IJ SOLN
INTRAMUSCULAR | Status: AC
  Filled 2019-07-26: qty 30

## 2019-07-26 MED ORDER — SODIUM CHLORIDE 0.9 % IV BOLUS
1000.0000 mL | Freq: Once | INTRAVENOUS | Status: AC
  Administered 2019-07-26: 1000 mL via INTRAVENOUS

## 2019-07-26 SURGICAL SUPPLY — 8 items
CABLE ADAPT CONN TEMP 6FT (ADAPTER) ×1 IMPLANT
CATH S G BIP PACING (CATHETERS) ×1 IMPLANT
PACK CARDIAC CATHETERIZATION (CUSTOM PROCEDURE TRAY) ×1 IMPLANT
PROTECTION STATION PRESSURIZED (MISCELLANEOUS) ×2
SHEATH PINNACLE 6F 10CM (SHEATH) ×1 IMPLANT
SHEATH PROBE COVER 6X72 (BAG) ×1 IMPLANT
SLEEVE REPOSITIONING LENGTH 30 (MISCELLANEOUS) ×1 IMPLANT
STATION PROTECTION PRESSURIZED (MISCELLANEOUS) IMPLANT

## 2019-07-26 NOTE — Plan of Care (Signed)
  Problem: Education: Goal: Knowledge of General Education information will improve Description: Including pain rating scale, medication(s)/side effects and non-pharmacologic comfort measures Outcome: Progressing   Problem: Clinical Measurements: Goal: Ability to maintain clinical measurements within normal limits will improve Outcome: Progressing Goal: Will remain free from infection Outcome: Progressing Goal: Respiratory complications will improve Outcome: Progressing Goal: Cardiovascular complication will be avoided Outcome: Progressing   Problem: Activity: Goal: Risk for activity intolerance will decrease Outcome: Progressing   Problem: Activity: Goal: Risk for activity intolerance will decrease Outcome: Progressing   Problem: Pain Managment: Goal: General experience of comfort will improve Outcome: Progressing   Problem: Safety: Goal: Ability to remain free from injury will improve Outcome: Progressing   Problem: Pain Managment: Goal: General experience of comfort will improve Outcome: Progressing

## 2019-07-26 NOTE — ED Notes (Signed)
Portable cxr at bedside

## 2019-07-26 NOTE — ED Provider Notes (Signed)
[facility] EMERGENCY DEPARTMENT Provider Note   CSN: [id number] Arrival date & time: 07/26/19  1700     History   Chief Complaint Chief Complaint  Patient presents with  . Bradycardia    HPI Steven Krueger. is a 84 y.o. male.     Pt presents to the ED today with bradycardia.  Pt went to his pcp's office today because he had a passing out spell yesterday while driving.  Pt's HR was noted to be very low, so EMS was called and he was brought here.  Pt denies cp, sob.  He has dizziness occasionally.  HR was as low as the 20s per EMS.     Past Medical History:  Diagnosis Date  . Chronic kidney disease, stage 3 04/01/2014  . CVA, old, hemiparesis ([place]) 04/01/2014  . Essential hypertension 04/01/2014  . Hyperlipidemia   . Hypothyroidism    takes Synthroid daily  . Kidney stones   . Osteoarthritis of right knee 04/01/2014  . 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 Ventricular bigeminy 06/27/2014    Patient Active Problem List   Diagnosis Date Noted  . Complete heart block ([place]) 07/26/2019  . Excessive thirst 12/19/2015  . Hyperlipemia 12/19/2015  . Knee pain 12/19/2015  . Malaise and fatigue 12/19/2015  . Multiple and bilateral precerebral artery stenosis 12/19/2015  . Numbness and tingling of foot 12/19/2015  . 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/19/2015  . Sensation of cold in lower extremity 12/19/2015  . Shoulder injury 12/19/2015  . Sinus bradycardia 12/19/2015  . Osteoarthritis of knee 12/19/2015  . Allergic rhinitis 11/14/2015  . 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  . Bradycardia 06/27/2014  . Bigeminy 06/27/2014  . Ventricular bigeminy 06/27/2014  . Essential hypertension 04/01/2014  . Hypothyroidism 04/01/2014  . CVA, old, hemiparesis ([place]) 04/01/2014  . Stage 3 chronic kidney disease 04/01/2014  . Osteoarthritis of right  knee 04/01/2014  . Hemiplegia ([place]) 04/01/2014    Past Surgical History:  Procedure Laterality Date  . appendectomy    . COLONOSCOPY WITH PROPOFOL N/A 04/09/2016   Procedure: COLONOSCOPY WITH PROPOFOL;  Surgeon: Carol Ada, MD;  Location: [facility];  Service: Endoscopy;  Laterality: N/A;        Home Medications    Prior to Admission medications   Medication Sig Start Date End Date Taking? Authorizing Provider  allopurinol (ZYLOPRIM) 300 MG tablet TAKE ONE TABLET BY MOUTH EVERY DAY 06/02/17  Yes Sanjuana Kava, MD  aspirin EC 81 MG tablet Take 1 tablet (81 mg total) by mouth daily. 04/19/17  Yes Camnitz, Will Hassell Done, MD  Cholecalciferol (VITAMIN D3) 10 MCG (400 UNIT) CHEW Chew 10 mcg by mouth daily.   Yes [provider]  levothyroxine (SYNTHROID, LEVOTHROID) 25 MCG tablet Take 25 mcg by mouth daily.   Yes [provider]  lisinopril-hydrochlorothiazide (PRINZIDE,ZESTORETIC) 20-12.5 MG per tablet Take 1 tablet by mouth daily.  03/12/14  Yes [provider]    Family History Family History  Problem Relation Age of Onset  . CAD Mother   . Cancer Father   . Heart disease Other   . Hypertension Other   . Cancer Other     Social History Social History   Tobacco Use  . Smoking status: Former Research scientist (life sciences)  . Smokeless tobacco: Current User    Types: Chew  Substance Use Topics  . Alcohol use: No  .  Drug use: No     Allergies   Patient has no known allergies.   Review of Systems Review of Systems  Neurological: Positive for dizziness and syncope.  All other systems reviewed and are negative.    Physical Exam Updated Vital Signs BP (!) 108/59   Pulse 77   Temp 97.8 F (36.6 C) (Oral)   Resp 20   Ht 5\' 8"  (1.727 m)   Wt 86.2 kg   SpO2 97%   BMI 28.89 kg/m   Physical Exam Vitals signs and nursing note reviewed.  Constitutional:      Appearance: Normal appearance.  HENT:     Head: Normocephalic and atraumatic.     Right Ear:  External ear normal.     Left Ear: External ear normal.     Nose: Nose normal.     Mouth/Throat:     Mouth: Mucous membranes are moist.     Pharynx: Oropharynx is clear.  Eyes:     Extraocular Movements: Extraocular movements intact.     Conjunctiva/sclera: Conjunctivae normal.     Pupils: Pupils are equal, round, and reactive to light.  Neck:     Musculoskeletal: Normal range of motion and neck supple.  Cardiovascular:     Rate and Rhythm: Bradycardia present.     Pulses: Normal pulses.     Heart sounds: Normal heart sounds.  Pulmonary:     Effort: Pulmonary effort is normal.     Breath sounds: Normal breath sounds.  Abdominal:     General: Abdomen is flat. Bowel sounds are normal.     Palpations: Abdomen is soft.  Musculoskeletal: Normal range of motion.  Skin:    General: Skin is warm.     Capillary Refill: Capillary refill takes less than 2 seconds.  Neurological:     General: No focal deficit present.     Mental Status: He is alert and oriented to person, place, and time.  Psychiatric:        Mood and Affect: Mood normal.        Behavior: Behavior normal.        Thought Content: Thought content normal.        Judgment: Judgment normal.      ED Treatments / Results  Labs (all labs ordered are listed, but only abnormal results are displayed) Labs Reviewed  CBC - Abnormal; Notable for the following components:      Result Value   Hemoglobin 12.9 (*)    All other components within normal limits  SARS CORONAVIRUS 2 BY RT PCR (HOSPITAL ORDER, PERFORMED IN [facility])  BASIC METABOLIC PANEL  TROPONIN I (HIGH SENSITIVITY)    EKG EKG Interpretation  Date/Time:  Thursday July 26 2019 17:01:17 EDT Ventricular Rate:  36 PR Interval:    QRS Duration: 168 QT Interval:  457 QTC Calculation: 354 R Axis:   -102 Text Interpretation:  complete heart block Confirmed by Jacalyn Lefevre [phone]) on 07/26/2019 6:01:03 PM   Radiology Dg Chest Port 1 View   Result Date: 07/26/2019 CLINICAL DATA:  Patient with syncopal episode. EXAM: PORTABLE CHEST 1 VIEW COMPARISON:  Chest radiograph 04/17/2018 FINDINGS: Monitoring leads overlie the patient. Pacer pad overlies the patient. Stable cardiac and mediastinal contours. Low lung volumes. Bibasilar atelectasis. No pleural effusion or pneumothorax. Thoracic spine degenerative changes. IMPRESSION: Low lung volumes with basilar atelectasis. Electronically Signed   By: Annia Belt M.D.   On: 07/26/2019 18:17    Procedures Procedures (including critical care  time)  Medications Ordered in ED Medications  lidocaine (PF) (XYLOCAINE) 1 % injection (15 mLs  Given 07/26/19 1820)  Heparin (Porcine) in NaCl 1000-0.9 UT/500ML-% SOLN (500 mLs  Given 07/26/19 1820)  atropine 1 MG/10ML injection (0.5 mg  Given 07/26/19 1711)  sodium chloride 0.9 % bolus 1,000 mL (1,000 mLs Intravenous New Bag/Given 07/26/19 1714)  lidocaine (PF) (XYLOCAINE) 1 % injection (has no administration in time range)  Heparin (Porcine) in NaCl 1000-0.9 UT/500ML-% SOLN (has no administration in time range)     Initial Impression / Assessment and Plan / ED Course  I have reviewed the triage vital signs and the nursing notes.  Pertinent labs & imaging results that were available during my care of the patient were reviewed by me and considered in my medical decision making (see chart for details).   Pt is not on any AV nodal blocking agents.  Pt given 0.5 mg of Atropine which did not help.  Hi was given IVFs to help his bp and this is better.  Pt d/w Dr. Jens Som (cards) who will see pt.  Dr. Jens Som quickly saw pt.  While he was examining pt, pt's HR dropped briefly to 12 and pt became dizzy.  Cards will admit for temporary TV pacemaker tonight and permanent pacer tomorrow.    CRITICAL CARE Performed by: Jacalyn Lefevre   Total critical care time: 30 minutes  Critical care time was exclusive of separately billable procedures and treating  other patients.  Critical care was necessary to treat or prevent imminent or life-threatening deterioration.  Critical care was time spent personally by me on the following activities: development of treatment plan with patient and/or surrogate as well as nursing, discussions with consultants, evaluation of patient's response to treatment, examination of patient, obtaining history from patient or surrogate, ordering and performing treatments and interventions, ordering and review of laboratory studies, ordering and review of radiographic studies, pulse oximetry and re-evaluation of patient's condition.  Final Clinical Impressions(s) / ED Diagnoses   Final diagnoses:  Complete heart block (HCC)  Syncope, unspecified syncope type    ED Discharge Orders    None       Jacalyn Lefevre, MD 07/26/19 [phone]
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
## 2019-07-26 NOTE — ED Triage Notes (Signed)
Pt presents from PCP office with EMS, was found to have pulse in 40s and heart block on monitor at PCP, sent to ED via EMS. En route, EMS notes pulse 28bpm, denies dizziness, CP, N/V. BP 118/64.

## 2019-07-27 ENCOUNTER — Other Ambulatory Visit (HOSPITAL_COMMUNITY): Payer: Medicare HMO

## 2019-07-27 ENCOUNTER — Inpatient Hospital Stay (HOSPITAL_COMMUNITY)
Admission: EM | Disposition: A | Discharge status: Home / Self Care | Payer: Self-pay | Source: Ambulatory Visit | Attending: Cardiology

## 2019-07-27 ENCOUNTER — Ambulatory Visit (HOSPITAL_COMMUNITY): Admit: 2019-07-27 | Payer: Medicare HMO | Admitting: Internal Medicine

## 2019-07-27 ENCOUNTER — Inpatient Hospital Stay (HOSPITAL_COMMUNITY): Payer: Medicare HMO

## 2019-07-27 ENCOUNTER — Encounter (HOSPITAL_COMMUNITY): Payer: Self-pay | Admitting: Cardiovascular Disease

## 2019-07-27 DIAGNOSIS — I442 Atrioventricular block, complete: Secondary | ICD-10-CM

## 2019-07-27 DIAGNOSIS — Z950 Presence of cardiac pacemaker: Secondary | ICD-10-CM

## 2019-07-27 HISTORY — PX: PACEMAKER IMPLANT: EP1218

## 2019-07-27 HISTORY — DX: Presence of cardiac pacemaker: Z95.0

## 2019-07-27 LAB — CBC
HCT: 37.4 % — ABNORMAL LOW (ref 39.0–52.0)
Hemoglobin: 12.3 g/dL — ABNORMAL LOW (ref 13.0–17.0)
MCH: 29.4 pg (ref 26.0–34.0)
MCHC: 32.9 g/dL (ref 30.0–36.0)
MCV: 89.3 fL (ref 80.0–100.0)
Platelets: 184 10*3/uL (ref 150–400)
RBC: 4.19 MIL/uL — ABNORMAL LOW (ref 4.22–5.81)
RDW: 14.8 % (ref 11.5–15.5)
WBC: 5 10*3/uL (ref 4.0–10.5)
nRBC: 0 % (ref 0.0–0.2)

## 2019-07-27 LAB — BASIC METABOLIC PANEL
Anion gap: 9 (ref 5–15)
BUN: 24 mg/dL — ABNORMAL HIGH (ref 8–23)
CO2: 24 mmol/L (ref 22–32)
Calcium: 8.5 mg/dL — ABNORMAL LOW (ref 8.9–10.3)
Chloride: 107 mmol/L (ref 98–111)
Creatinine, Ser: 1.93 mg/dL — ABNORMAL HIGH (ref 0.61–1.24)
GFR calc Af Amer: 37 mL/min — ABNORMAL LOW (ref >60)
GFR calc non Af Amer: 32 mL/min — ABNORMAL LOW (ref >60)
Glucose, Bld: 86 mg/dL (ref 70–99)
Potassium: 4.2 mmol/L (ref 3.5–5.1)
Sodium: 140 mmol/L (ref 135–145)

## 2019-07-27 LAB — SURGICAL PCR SCREEN
MRSA, PCR: NEGATIVE
Staphylococcus aureus: NEGATIVE

## 2019-07-27 SURGERY — PACEMAKER IMPLANT

## 2019-07-27 MED ORDER — LIDOCAINE HCL 1 % IJ SOLN
INTRAMUSCULAR | Status: AC
  Filled 2019-07-27: qty 60

## 2019-07-27 MED ORDER — ONDANSETRON HCL 4 MG/2ML IJ SOLN: 4.0000 mg | Freq: Four times a day (QID) | INTRAMUSCULAR | Status: DC | PRN

## 2019-07-27 MED ORDER — LIDOCAINE HCL (PF) 1 % IJ SOLN
INTRAMUSCULAR | Status: DC | PRN
  Administered 2019-07-27: 60 mL

## 2019-07-27 MED ORDER — ACETAMINOPHEN 325 MG PO TABS
325.0000 mg | ORAL_TABLET | ORAL | Status: DC | PRN
  Administered 2019-07-27: 650 mg via ORAL
  Filled 2019-07-27: qty 2

## 2019-07-27 MED ORDER — CEFAZOLIN SODIUM-DEXTROSE 2-4 GM/100ML-% IV SOLN
2.0000 g | INTRAVENOUS | Status: AC
  Administered 2019-07-27: 2 g via INTRAVENOUS

## 2019-07-27 MED ORDER — CEFAZOLIN SODIUM-DEXTROSE 1-4 GM/50ML-% IV SOLN
1.0000 g | Freq: Four times a day (QID) | INTRAVENOUS | Status: AC
  Administered 2019-07-27 – 2019-07-28 (×3): 1 g via INTRAVENOUS
  Filled 2019-07-27 (×3): qty 50

## 2019-07-27 MED ORDER — HEPARIN (PORCINE) IN NACL 1000-0.9 UT/500ML-% IV SOLN
INTRAVENOUS | Status: AC
  Filled 2019-07-27: qty 500

## 2019-07-27 MED ORDER — CEFAZOLIN SODIUM-DEXTROSE 2-4 GM/100ML-% IV SOLN
INTRAVENOUS | Status: AC
  Filled 2019-07-27: qty 100

## 2019-07-27 MED ORDER — SODIUM CHLORIDE 0.9 % IV SOLN
80.0000 mg | INTRAVENOUS | Status: AC
  Administered 2019-07-27: 80 mg

## 2019-07-27 MED ORDER — SODIUM CHLORIDE 0.9 % IV SOLN
INTRAVENOUS | Status: DC
  Administered 2019-07-27: 11:00:00 via INTRAVENOUS

## 2019-07-27 MED ORDER — SODIUM CHLORIDE 0.9 % IV SOLN
INTRAVENOUS | Status: AC
  Filled 2019-07-27: qty 2

## 2019-07-27 MED ORDER — HEPARIN (PORCINE) IN NACL 1000-0.9 UT/500ML-% IV SOLN
INTRAVENOUS | Status: DC | PRN
  Administered 2019-07-27: 500 mL

## 2019-07-27 SURGICAL SUPPLY — 7 items
CABLE SURGICAL S-101-97-12 (CABLE) ×2 IMPLANT
LEAD TENDRIL MRI 52CM LPA1200M (Lead) ×2 IMPLANT
LEAD TENDRIL MRI 58CM LPA1200M (Lead) ×2 IMPLANT
PACEMAKER ASSURITY DR-RF (Pacemaker) ×2 IMPLANT
PAD PRO RADIOLUCENT 2001M-C (PAD) ×2 IMPLANT
SHEATH 8FR PRELUDE SNAP 13 (SHEATH) ×4 IMPLANT
TRAY PACEMAKER INSERTION (PACKS) ×2 IMPLANT

## 2019-07-27 NOTE — Discharge Instructions (Signed)
After Your Pacemaker   You have a [employer]. Jude Pacemaker   Do not lift your arm above shoulder height for 1 week after your procedure. After 7 days, you may progress as below.     Friday August 03, 2019  Saturday August 04, 2019 Sunday August 05, 2019 Monday August 06, 2019    Do not lift, push, pull, or carry anything over 10 pounds with the affected arm until 6 weeks (Friday September 07, 2019) after your procedure.    Do not drive until your wound check, or until instructed by your healthcare provider that you are safe to do so.    Monitor your pacemaker site for redness, swelling, and drainage. Call the device clinic at [phone] if you experience these symptoms or fever/chills.   If your incision is sealed with Steri-strips or staples. You may shower 7 days after your procedure and wash your incision with soap and water as long as it is healed. If your incision is closed with Dermabond/Surgical glue. You may shower 1 day after your pacemaker implant and wash your incision with soap and water. Avoid lotions, ointments, or perfumes over your incision until it is well-healed.   You may use a hot tub or a pool AFTER your wound check appointment if the incision is completely closed.   Your Pacemaker may be MRI compatible. We will discuss this at your first follow up/wound check. .    Remote monitoring is used to monitor your pacemaker from home. This monitoring is scheduled every 91 days by our office. It allows [place] to keep an eye on the functioning of your device to ensure it is working properly. You will routinely see your Electrophysiologist annually (more often if necessary).    Pacemaker Implantation, Care After This sheet gives you information about how to care for yourself after your procedure. Your health care provider may also give you more specific instructions. If you have problems or questions, contact your health care provider. What can I expect after the  procedure? After the procedure, it is common to have:  Mild pain.  Slight bruising.  Some swelling over the incision.  A slight bump over the skin where the device was placed. Sometimes, it is possible to feel the device under the skin. This is normal.  You should received your Pacemaker ID card within 4-8 weeks. Follow these instructions at home: Medicines  Take over-the-counter and prescription medicines only as told by your health care provider.  If you were prescribed an antibiotic medicine, take it as told by your health care provider. Do not stop taking the antibiotic even if you start to feel better. Wound care     Do not remove the bandage on your chest until directed to do so by your health care provider.  After your bandage is removed, you may see pieces of tape called skin adhesive strips over the area where the cut was made (incision site). Let them fall off on their own.  Check the incision site every day to make sure it is not infected, bleeding, or starting to pull apart.  Do not use lotions or ointments near the incision site unless directed to do so.  Keep the incision area clean and dry for 7 days after the procedure or as directed by your health care provider. It takes several weeks for the incision site to completely heal.  Do not take baths, swim, or use a hot tub for 7-10 days or as otherwise directed by  your health care provider. Activity  Do not drive or use heavy machinery while taking prescription pain medicine.  Do not drive for 24 hours if you were given a medicine to help you relax (sedative).  Check with your health care provider before you start to drive or play sports.  Avoid sudden jerking, pulling, or chopping movements that pull your upper arm far away from your body. Avoid these movements for at least 6 weeks or as long as told by your health care provider.  Do not lift your upper arm above your shoulders for at least 6 weeks or as long  as told by your health care provider. This means no tennis, golf, or swimming.  You may go back to work when your health care provider says it is okay. Pacemaker care  You may be shown how to transfer data from your pacemaker through the phone to your health care provider.  Always let all health care providers know about your pacemaker before you have any medical procedures or tests.  Wear a medical ID bracelet or necklace stating that you have a pacemaker. Carry a pacemaker ID card with you at all times.  Your pacemaker battery will last for 5-15 years. Routine checks by your health care provider will let the health care provider know when the battery is starting to run down. The pacemaker will need to be replaced when the battery starts to run down.  Do not use amateur Chief of Staff. Other electrical devices are safe to use, including power tools, lawn mowers, and speakers. If you are unsure of whether something is safe to use, ask your health care provider.  When using your cell phone, hold it to the ear opposite the pacemaker. Do not leave your cell phone in a pocket over the pacemaker.  Avoid places or objects that have a strong electric or magnetic field, including: ? Airport Herbalist. When at the airport, let officials know that you have a pacemaker. ? Power plants. ? Large electrical generators. ? Radiofrequency transmission towers, such as cell phone and radio towers. General instructions  Weigh yourself every day. If you suddenly gain weight, fluid may be building up in your body.  Keep all follow-up visits as told by your health care provider. This is important. Contact a health care provider if:  You gain weight suddenly.  Your legs or feet swell.  It feels like your heart is fluttering or skipping beats (heart palpitations).  You have chills or a fever.  You have more redness, swelling, or pain around your incisions.  You have  more fluid or blood coming from your incisions.  Your incisions feel warm to the touch.  You have pus or a bad smell coming from your incisions. Get help right away if:  You have chest pain.  You have trouble breathing or are short of breath.  You become extremely tired.  You are light-headed or you faint. This information is not intended to replace advice given to you by your health care provider. Make sure you discuss any questions you have with your health care provider.
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
## 2019-07-27 NOTE — Progress Notes (Signed)
EKG CRITICAL VALUE     12 lead EKG performed.  Critical value noted. Lesli Albee, RN notified.   Cline Draheim H, CCT 07/27/2019 7:30 AM

## 2019-07-27 NOTE — Interval H&P Note (Signed)
History and Physical Interval Note:  07/27/2019 11:12 AM  Steven Thomasenia Bottoms Sr.  has presented today for surgery, with the diagnosis of heart block.  The various methods of treatment have been discussed with the patient and family. After consideration of risks, benefits and other options for treatment, the patient has consented to  Procedure(s): PACEMAKER IMPLANT (N/A) as a surgical intervention.  The patient's history has been reviewed, patient examined, no change in status, stable for surgery.  I have reviewed the patient's chart and labs.  Questions were answered to the patient's satisfaction.     Cristopher Peru

## 2019-07-27 NOTE — Consult Note (Addendum)
ELECTROPHYSIOLOGY CONSULT NOTE    Patient ID: Steven Plan Sr. MRN: [id number], DOB/AGE: 82/10/1934 82 y.o.  Admit date: 07/26/2019 Date of Consult: 07/27/2019  Primary Physician: Aletha Halim., PA-C Primary Cardiologist: Dr. Tamala Julian Electrophysiologist: Dr. Curt Bears  Referring Provider: Dr. Stanford Breed  Patient Profile: Steven Plan Sr. is a 84 y.o. male with a history of bradycardia, PVCs, HTN, prior CVA, HLD, and CKD who is being seen today for the evaluation of CHB at the request of Dr. Stanford Breed.  HPI:  Steven Arnett. is a 84 y.o. male who was seen at his PCP office 07/26/2019 after a syncopal episode while driving 25/0/0370. Found to be in CHB and transferred to ED. He had transient HRs as low as 12, so was taken urgently to the cath lab 10/8 for temp pacemaker placement.   He is feeling somewhat better this am with temp pacer in place.    Past Medical History:  Diagnosis Date  . Chronic kidney disease, stage 3 04/01/2014  . CVA, old, hemiparesis ([place]) 04/01/2014  . Essential hypertension 04/01/2014  . Hyperlipidemia   . Hypothyroidism    takes Synthroid daily  . Kidney stones   . Osteoarthritis of right knee 04/01/2014  . 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 Ventricular bigeminy 06/27/2014     Surgical History:  Past Surgical History:  Procedure Laterality Date  . appendectomy    . COLONOSCOPY WITH PROPOFOL N/A 04/09/2016   Procedure: COLONOSCOPY WITH PROPOFOL;  Surgeon: Carol Ada, MD;  Location: [facility];  Service: Endoscopy;  Laterality: N/A;  . TEMPORARY PACEMAKER N/A 07/26/2019   Procedure: TEMPORARY PACEMAKER;  Surgeon: Burnell Blanks, MD;  Location: [facility];  Service: Cardiovascular;  Laterality: N/A;     Medications Prior to Admission  Medication Sig Dispense Refill Last Dose  . allopurinol (ZYLOPRIM)  300 MG tablet TAKE ONE TABLET BY MOUTH EVERY DAY (Patient taking differently: Take 300 mg by mouth daily. ) 30 tablet 5 07/25/2019 at Unknown time  . aspirin EC 81 MG tablet Take 1 tablet (81 mg total) by mouth daily. 90 tablet 3 07/25/2019 at Unknown time  . Cholecalciferol (VITAMIN D3) 10 MCG (400 UNIT) CHEW Chew 10 mcg by mouth daily.   07/25/2019 at Unknown time  . levothyroxine (SYNTHROID, LEVOTHROID) 25 MCG tablet Take 25 mcg by mouth daily.   07/25/2019 at Unknown time  . lisinopril-hydrochlorothiazide (PRINZIDE,ZESTORETIC) 20-12.5 MG per tablet Take 1 tablet by mouth daily.    07/25/2019 at Unknown time    Inpatient Medications:  . aspirin EC  81 mg Oral Daily  . Chlorhexidine Gluconate Cloth  6 each Topical Daily  . heparin  5,000 Units Subcutaneous Q8H  . influenza vaccine adjuvanted  0.5 mL Intramuscular Tomorrow-1000  . levothyroxine  25 mcg Oral QAC breakfast  . sodium chloride flush  3 mL Intravenous Q12H    Allergies: No Known Allergies  Social History   Socioeconomic History  . Marital status: Married    Spouse name: Not on file  . Number of children: 2  . Years of education: Not on file  . Highest education level: Not on file  Occupational History  . Occupation: retired  Scientific laboratory technician  . Financial resource strain: Not on file  . Food insecurity    Worry: Not on file    Inability: Not on file  .  Transportation needs    Medical: Not on file    Non-medical: Not on file  Tobacco Use  . Smoking status: Former Smoker  . Smokeless tobacco: Current User    Types: Chew  Substance and Sexual Activity  . Alcohol use: No  . Drug use: No  . Sexual activity: Not on file  Lifestyle  . Physical activity    Days per week: Not on file    Minutes per session: Not on file  . Stress: Not on file  Relationships  . Social connections    Talks on phone: Not on file    Gets together: Not on file    Attends religious service: Not on file    Active member of club or  organization: Not on file    Attends meetings of clubs or organizations: Not on file    Relationship status: Not on file  . Intimate partner violence    Fear of current or ex partner: Not on file    Emotionally abused: Not on file    Physically abused: Not on file    Forced sexual activity: Not on file  Other Topics Concern  . Not on file  Social History Narrative  . Not on file     Family History  Problem Relation Age of Onset  . CAD Mother   . Cancer Father   . Heart disease Other   . Hypertension Other   . Cancer Other      Review of Systems: All other systems reviewed and are otherwise negative except as noted above.  Physical Exam: Vitals:   07/27/19 0500 07/27/19 0600 07/27/19 0630 07/27/19 0700  BP: 125/75 (!) 146/113  (!) 145/89  Pulse: 60 60 60 60  Resp: (!) 24 16 (!) 22 13  Temp:      TempSrc:      SpO2: 98% 96% 96% 95%  Weight:   76.6 kg   Height:        GEN- The patient is elderly appearing, alert and oriented x 3 today.   HEENT: normocephalic, atraumatic; sclera clear, conjunctiva pink; hearing intact; oropharynx clear; neck supple Lungs- Clear to ausculation bilaterally, normal work of breathing.  No wheezes, rales, rhonchi Heart- Regular rate and rhythm, no murmurs, rubs or gallops GI- soft, non-tender, non-distended, bowel sounds present Extremities- no clubbing, cyanosis, or edema; DP/PT/radial pulses 2+ bilaterally MS- no significant deformity or atrophy Skin- warm and dry, no rash or lesion Psych- euthymic mood, full affect Neuro- strength and sensation are intact  Labs:   Lab Results  Component Value Date   WBC 5.0 07/27/2019   HGB 12.3 (L) 07/27/2019   HCT 37.4 (L) 07/27/2019   MCV 89.3 07/27/2019   PLT 184 07/27/2019    Recent Labs  Lab 07/27/19 0207  NA 140  K 4.2  CL 107  CO2 24  BUN 24*  CREATININE 1.93*  CALCIUM 8.5*  GLUCOSE 86      Radiology/Studies: Dg Chest Port 1 View  Result Date: 07/26/2019 CLINICAL DATA:   Patient with syncopal episode. EXAM: PORTABLE CHEST 1 VIEW COMPARISON:  Chest radiograph 04/17/2018 FINDINGS: Monitoring leads overlie the patient. Pacer pad overlies the patient. Stable cardiac and mediastinal contours. Low lung volumes. Bibasilar atelectasis. No pleural effusion or pneumothorax. Thoracic spine degenerative changes. IMPRESSION: Low lung volumes with basilar atelectasis. Electronically Signed   By: Drew  Davis M.D.   On: 07/26/2019 18:17    EKG: This am shows CHB with escape beat at 31   bpm  (personally reviewed)  TELEMETRY: V paced at 60 (personally reviewed)  Assessment/Plan: 1.  CHB Pt found to have symptomatic bradycardia due to CHB with no reversible causes identified.  He will be taken for PPM this afternoon. Risks and benefits discussed at length with the patient.   For questions or updates, please contact [facility] Please consult [email or web address] for contact info under Cardiology/STEMI.  Wilhelmina Mcardle  Pager: [phone]  07/27/2019 8:03 AM   EP Consult note  Patient seen and examined. Agree with the findings as noted above. The patient has a h/o trifascicular block and has developed CHB. His escape was minimal. He has undergone a temporary PM and this morning he remains pacemaker dependent. I have discussed the indications/risks/benefits/goals/expectations of PPM insertion and he wishes to proceed.  Leonia Reeves.D.

## 2019-07-28 ENCOUNTER — Encounter (HOSPITAL_COMMUNITY): Payer: Self-pay | Admitting: Physician Assistant

## 2019-07-28 ENCOUNTER — Inpatient Hospital Stay (HOSPITAL_COMMUNITY): Payer: Medicare HMO

## 2019-07-28 DIAGNOSIS — R9431 Abnormal electrocardiogram [ECG] [EKG]: Secondary | ICD-10-CM

## 2019-07-28 LAB — ECHOCARDIOGRAM COMPLETE
Height: 68 in
Weight: 2745.6 oz

## 2019-07-28 MED ORDER — PERFLUTREN LIPID MICROSPHERE
1.0000 mL | INTRAVENOUS | Status: AC | PRN
  Administered 2019-07-28: 4 mL via INTRAVENOUS
  Filled 2019-07-28: qty 10

## 2019-07-28 NOTE — Plan of Care (Signed)
  Problem: Elimination: Goal: Will not experience complications related to urinary retention Outcome: Progressing Note: No s/s of urinary retention; voiding in urinal without difficulty.

## 2019-07-28 NOTE — Progress Notes (Signed)
Patient Name: Steven GLEAVES Sr.      SUBJECTIVE:*admitted with syncope and found to have CHB and underwent pacing 10/9  No complaints, no sob or chest pain   Past Medical History:  Diagnosis Date  . Chronic kidney disease, stage 3 04/01/2014  . CVA, old, hemiparesis ([place]) 04/01/2014  . Essential hypertension 04/01/2014  . Hyperlipidemia   . Hypothyroidism    takes Synthroid daily  . Kidney stones   . Osteoarthritis of right knee 04/01/2014  . 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 Ventricular bigeminy 06/27/2014    Scheduled Meds:  Scheduled Meds: . aspirin EC  81 mg Oral Daily  . Chlorhexidine Gluconate Cloth  6 each Topical Daily  . heparin  5,000 Units Subcutaneous Q8H  . levothyroxine  25 mcg Oral QAC breakfast  . sodium chloride flush  3 mL Intravenous Q12H   Continuous Infusions: . sodium chloride 250 mL (07/26/19 1955)   sodium chloride, acetaminophen, nitroGLYCERIN, ondansetron (ZOFRAN) IV, sodium chloride flush    PHYSICAL EXAM Vitals:   07/27/19 2300 07/28/19 0526 07/28/19 0744 07/28/19 0839  BP: (!) 142/71 (!) 160/69 (!) 147/80 (!) 127/93  Pulse: 60 82 (!) 59 68  Resp: 20 (!) 25 (!) 22 20  Temp:  98.1 F (36.7 C) 98.4 F (36.9 C)   TempSrc:  Oral Oral   SpO2: 93% 99% 95% 97%  Weight:  77.8 kg    Height:        Well developed and nourished in no acute distress HENT normal Neck supple with JVP- Pocket without  hematoma, swelling or tenderness  Clear Regular rate and rhythm, no murmurs or gallops Abd-soft with active BS No Clubbing cyanosis edema Skin-warm and dry A & Oriented  Grossly normal sensory and motor function  ECG P-synchronous/ AV  pacing   TELEMETRY: Reviewed personnally pt in *P-synchronous/ AV  pacing     Intake/Output Summary (Last 24 hours) at 07/28/2019 0940 Last data filed at  07/28/2019 0908 Gross per 24 hour  Intake 160 ml  Output 1125 ml  Net -965 ml    LABS: Basic Metabolic Panel: Recent Labs  Lab 07/26/19 1708 07/26/19 2025 07/27/19 0207  NA 137  --  140  K 4.5  --  4.2  CL 107  --  107  CO2 21*  --  24  GLUCOSE 83  --  86  BUN 26*  --  24*  CREATININE 1.75* 1.66* 1.93*  CALCIUM 8.7*  --  8.5*   Cardiac Enzymes: No results for input(s): CKTOTAL, CKMB, CKMBINDEX, TROPONINI in the last 72 hours. CBC: Recent Labs  Lab 07/26/19 1708 07/26/19 2025 07/27/19 0207  WBC 4.8 4.8 5.0  HGB 12.9* 14.1 12.3*  HCT 39.6 41.6 37.4*  MCV 91.5 88.7 89.3  PLT 202 192 184   PROTIME: No results for input(s): LABPROT, INR in the last 72 hours. Liver Function Tests: No results for input(s): AST, ALT, ALKPHOS, BILITOT, PROT, ALBUMIN in the last 72 hours. No results for input(s): LIPASE, AMYLASE in the last 72 hours. BNP: BNP (last 3 results) No results for input(s): BNP in the last 8760 hours.  ProBNP (last 3 results) No results for input(s): PROBNP in the last 8760 hours.  D-Dimer: No results for input(s): DDIMER in the last 72 hours. Hemoglobin A1C: No results for input(s): HGBA1C  in the last 72 hours. Fasting Lipid Panel: No results for input(s): CHOL, HDL, LDLCALC, TRIG, CHOLHDL, LDLDIRECT in the last 72 hours. Thyroid Function Tests: Recent Labs    07/26/19 2025  TSH 1.876   Anemia Panel: No results for input(s): VITAMINB12, FOLATE, FERRITIN, TIBC, IRON, RETICCTPCT in the last 72 hours.   Device Interrogation:*normal device function    ASSESSMENT AND PLAN:  Active Problems:   Complete heart block (HCC) REnal insufficiency gr 3  Pacemaker  Echo 2018 LVEF normal  Repeat pending  Ok for discharge  Instructions given    Signed, Sherryl Manges MD  07/28/2019

## 2019-07-28 NOTE — Plan of Care (Signed)

## 2019-07-28 NOTE — Progress Notes (Signed)
  Echocardiogram 2D Echocardiogram has been performed.  Steven Krueger 07/28/2019, 12:12 PM

## 2019-07-28 NOTE — Discharge Summary (Signed)
Discharge Summary    Patient ID: Steven PERKINS Sr. MRN: [id number]; DOB: Oct 10, 1935  Admit date: 07/26/2019 Discharge date: 07/28/2019  Primary Care Provider: Richmond Campbell., PA-C  Primary Cardiologist: Olga Millers, MD  Primary Electrophysiologist:  Will [name], MD   Discharge Diagnoses    Principal Problem:   Complete heart block [facility]) Active Problems:   Essential hypertension   Hypothyroidism   CVA, old, hemiparesis (HCC)   Stage 3 chronic kidney disease   Bradycardia   Trifascicular block   Allergies No Known Allergies  Diagnostic Studies/Procedures    Echo 07/27/19: Final read pending _____________   History of Present Illness     Steven L Corbyn Wildey. is a 84 y.o. male with past medical history of prior CVA, hypertension, hyperlipidemia, trifascicular block, chronic stage III kidney disease with syncope and bradycardia.  Patient has been seen previously by Dr. Elberta Fortis with trifascicular block.  Echocardiogram March 2018 showed normal LV function, mild aortic insufficiency and mildly dilated aortic root.  Patient typically denies dyspnea on exertion, orthopnea, PND, syncope or exertional chest pain.  Yesterday while driving the patient states he had transient loss of consciousness.  He states he was further down the road that he should have been.  There was no preceding chest pain, palpitations, nausea.  He went to his primary care's office today and was found to be in heart block and transferred to the emergency room. In the emergency room while examining the patient his heart rate decreased to 12 and he had transient dizziness.  Cardiology now asked to evaluate.  Hospital Course     Consultants: EP    CHB Hx of trifascicular block Syncope  PPM insertion Pt admitted to cardiology and temporary pacing wire placed 07/26/19 for symptomatic bradycardia - HR dropped to 12 in ER. He was on no AV nodal blocking agents. EP consulted the next day and recommended  PPM.  St Jude dual chamber pacemaker inserted 07/27/19 without complications. CXR negative for PTX. Echo final read pending.   Hypertension May resume lisinopril-HCTZ.    CKD stage III sCr at discharge is above baseline from 2019 - 1.93. Likely secondary to bradycardia. Will need BMP next week.     Pt seen and examined by Dr. Graciela Husbands and deemed stable for discharge. Follow up has been made.   Did the patient have an acute coronary syndrome (MI, NSTEMI, STEMI, etc) this admission?:  No                               Did the patient have a percutaneous coronary intervention (stent / angioplasty)?:  No.   _____________  Discharge Vitals Blood pressure (!) 127/93, pulse 68, temperature 98.4 F (36.9 C), temperature source Oral, resp. rate 20, height 5\' 8"  (1.727 m), weight 77.8 kg, SpO2 97 %.  Filed Weights   07/26/19 1939 07/27/19 0630 07/28/19 0526  Weight: 77.9 kg 76.6 kg 77.8 kg    Labs & Radiologic Studies    CBC Recent Labs    07/26/19 2025 07/27/19 0207  WBC 4.8 5.0  HGB 14.1 12.3*  HCT 41.6 37.4*  MCV 88.7 89.3  PLT 192 184   Basic Metabolic Panel Recent Labs    09/26/19 1708 07/26/19 2025 07/27/19 0207  NA 137  --  140  K 4.5  --  4.2  CL 107  --  107  CO2 21*  --  24  GLUCOSE 83  --  86  BUN 26*  --  24*  CREATININE 1.75* 1.66* 1.93*  CALCIUM 8.7*  --  8.5*   Liver Function Tests No results for input(s): AST, ALT, ALKPHOS, BILITOT, PROT, ALBUMIN in the last 72 hours. No results for input(s): LIPASE, AMYLASE in the last 72 hours. High Sensitivity Troponin:   Recent Labs  Lab 07/26/19 1708 07/26/19 2025 07/26/19 2143  TROPONINIHS 17 29* 28*    BNP Invalid input(s): POCBNP D-Dimer No results for input(s): DDIMER in the last 72 hours. Hemoglobin A1C No results for input(s): HGBA1C in the last 72 hours. Fasting Lipid Panel No results for input(s): CHOL, HDL, LDLCALC, TRIG, CHOLHDL, LDLDIRECT in the last 72 hours. Thyroid Function Tests Recent  Labs    07/26/19 2025  TSH 1.876   _____________  Dg Chest 2 View  Result Date: 07/28/2019 CLINICAL DATA:  Pacemaker pt cannot lift lt arm EXAM: CHEST - 2 VIEW COMPARISON:  Radiograph 07/26/2019 FINDINGS: Interval placement of LEFT-sided pacemaker. No pneumothorax. Low lung volumes. Ectatic aorta. IMPRESSION: Placement of LEFT pacer.  No complication. Electronically Signed   By: Suzy Bouchard M.D.   On: 07/28/2019 10:29   Dg Chest Port 1 View  Result Date: 07/26/2019 CLINICAL DATA:  Patient with syncopal episode. EXAM: PORTABLE CHEST 1 VIEW COMPARISON:  Chest radiograph 04/17/2018 FINDINGS: Monitoring leads overlie the patient. Pacer pad overlies the patient. Stable cardiac and mediastinal contours. Low lung volumes. Bibasilar atelectasis. No pleural effusion or pneumothorax. Thoracic spine degenerative changes. IMPRESSION: Low lung volumes with basilar atelectasis. Electronically Signed   By: Lovey Newcomer M.D.   On: 07/26/2019 18:17   Disposition   Pt is being discharged home today in good condition.  Follow-up Plans & Appointments    Follow-up Information    Evans Lance, MD Follow up on 10/30/2019.   Specialty: Cardiology Why: at 330 for 3 month pacemaker follow up Contact information: [address]  [place] [address] [phone]        [facility] Follow up on 08/09/2019.   Why: at 1130 for pacemaker wound check Contact information: [place] [address] [phone]         Discharge Instructions    Diet - low sodium heart healthy   Complete by: As directed    Increase activity slowly   Complete by: As directed       Discharge Medications   Allergies as of 07/28/2019   No Known Allergies     Medication List    TAKE these medications   allopurinol 300 MG tablet Commonly known as: ZYLOPRIM TAKE ONE TABLET BY MOUTH EVERY DAY   aspirin EC  81 MG tablet Take 1 tablet (81 mg total) by mouth daily.   levothyroxine 25 MCG tablet Commonly known as: SYNTHROID Take 25 mcg by mouth daily.   lisinopril-hydrochlorothiazide 20-12.5 MG tablet Commonly known as: ZESTORETIC Take 1 tablet by mouth daily.   Vitamin D3 10 MCG (400 UNIT) Chew Chew 10 mcg by mouth daily.          Outstanding Labs/Studies   BMP at wound check  Duration of Discharge Encounter   Greater than 30 minutes including physician time.  Signed, Tami Lin Kylon Philbrook, PA 07/28/2019, 12:44 PM

## 2019-07-30 ENCOUNTER — Encounter (HOSPITAL_COMMUNITY): Payer: Self-pay | Admitting: Internal Medicine

## 2019-07-30 MED FILL — Lidocaine HCl Local Inj 1%: INTRAMUSCULAR | Qty: 60 | Status: AC

## 2019-08-09 ENCOUNTER — Other Ambulatory Visit: Payer: Self-pay

## 2019-08-09 ENCOUNTER — Ambulatory Visit: Payer: Medicare HMO

## 2019-08-09 ENCOUNTER — Ambulatory Visit: Payer: Medicare HMO | Admitting: Student

## 2019-08-09 VITALS — BP 128/72 | HR 48 | Ht 70.0 in | Wt 177.0 lb

## 2019-08-09 DIAGNOSIS — I5022 Chronic systolic (congestive) heart failure: Secondary | ICD-10-CM | POA: Diagnosis not present

## 2019-08-09 DIAGNOSIS — I493 Ventricular premature depolarization: Secondary | ICD-10-CM | POA: Diagnosis not present

## 2019-08-09 DIAGNOSIS — I10 Essential (primary) hypertension: Secondary | ICD-10-CM

## 2019-08-09 DIAGNOSIS — I453 Trifascicular block: Secondary | ICD-10-CM | POA: Diagnosis not present

## 2019-08-09 LAB — CUP PACEART INCLINIC DEVICE CHECK
Battery Remaining Longevity: 133 mo
Battery Voltage: 3.07 V
Brady Statistic RA Percent Paced: 19 %
Brady Statistic RV Percent Paced: 99 %
Date Time Interrogation Session: 20201022142546
Implantable Lead Implant Date: 20201009
Implantable Lead Implant Date: 20201009
Implantable Lead Location: 753859
Implantable Lead Location: 753860
Implantable Pulse Generator Implant Date: 20201009
Lead Channel Impedance Value: 512.5 Ohm
Lead Channel Impedance Value: 675 Ohm
Lead Channel Pacing Threshold Amplitude: 0.5 V
Lead Channel Pacing Threshold Amplitude: 0.5 V
Lead Channel Pacing Threshold Amplitude: 0.75 V
Lead Channel Pacing Threshold Pulse Width: 0.5 ms
Lead Channel Pacing Threshold Pulse Width: 0.5 ms
Lead Channel Pacing Threshold Pulse Width: 0.5 ms
Lead Channel Sensing Intrinsic Amplitude: 1.5 mV
Lead Channel Sensing Intrinsic Amplitude: 4 mV
Lead Channel Setting Pacing Amplitude: 0.75 V
Lead Channel Setting Pacing Amplitude: 1.75 V
Lead Channel Setting Pacing Pulse Width: 0.5 ms
Lead Channel Setting Sensing Sensitivity: 4 mV
Pulse Gen Model: 2272
Pulse Gen Serial Number: 9166894

## 2019-08-09 MED ORDER — LOSARTAN POTASSIUM 50 MG PO TABS: 50.0000 mg | ORAL_TABLET | Freq: Every day | ORAL | 3 refills | Status: DC

## 2019-08-09 MED ORDER — FUROSEMIDE 20 MG PO TABS: 20.0000 mg | ORAL_TABLET | ORAL | 1 refills | Status: DC | PRN

## 2019-08-09 MED ORDER — CARVEDILOL 3.125 MG PO TABS: 3.1250 mg | ORAL_TABLET | Freq: Two times a day (BID) | ORAL | 3 refills | Status: DC

## 2019-08-09 NOTE — Patient Instructions (Addendum)
Medication Instructions:   Stop : Lisinopril -HCTZ    Start taking:   Carvedilol 3.125 mg twice a day   Losartan 50 mg once a day   Furosemide  20 mg once a day as needed         *If you need a refill on your cardiac medications before your next appointment, please call your pharmacy*  Lab Work:  BMET TODAY   If you have labs (blood work) drawn today and your tests are completely normal, you will receive your results only by: Marland Kitchen MyChart Message (if you have MyChart) OR . A paper copy in the mail If you have any lab test that is abnormal or we need to change your treatment, we will call you to review the results.  Testing/Procedures: NONE ORDERED  TODAY   Follow-Up: At [facility], you and your health needs are our priority.  As part of our continuing mission to provide you with exceptional heart care, we have created designated Provider Care Teams.  These Care Teams include your primary Cardiologist (physician) and Advanced Practice Providers (APPs -  Physician Assistants and Nurse Practitioners) who all work together to provide you with the care you need, when you need it.  Your next appointment:   One Month   The format for your next appointment:   In Person  Provider:   Dr Tamala Julian   Other Instructions

## 2019-08-09 NOTE — Progress Notes (Signed)
Electrophysiology Office Note Date: 08/09/2019  ID:  Briyan Kleven., DOB 07-09-1935, MRN [id number]  PCP: Richmond Campbell., PA-C Primary Cardiologist: Olga Millers, MD Electrophysiologist: [name]  CC: Pacemaker follow-up  Nishanth Serafina Royals Sr. is a 84 y.o. male seen today for [name] s/p PPM implant 07/27/2019. he presents today for post PPM placement followup.  Since his device was placed, the patient reports doing very well.  he denies chest pain, palpitations, dyspnea, PND, orthopnea, nausea, vomiting, dizziness, syncope, edema, weight gain, or early satiety. He has been feeling much better.   Device History: St. Jude Dual Chamber PPM implanted 07/27/2019 for CHB  Past Medical History:  Diagnosis Date  . Chronic kidney disease, stage 3 04/01/2014  . Complete heart block (HCC) 07/26/2019   temp wire placed  . CVA, old, hemiparesis (HCC) 04/01/2014  . Essential hypertension 04/01/2014  . Hyperlipidemia   . Hypothyroidism    takes Synthroid daily  . Kidney stones   . Osteoarthritis of right knee 04/01/2014  . Pacemaker 07/27/2019   St Jude dual chamber  . 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 Ventricular bigeminy 06/27/2014   Past Surgical History:  Procedure Laterality Date  . appendectomy    . COLONOSCOPY WITH PROPOFOL N/A 04/09/2016   Procedure: COLONOSCOPY WITH PROPOFOL;  Surgeon: [name], MD;  Location: [facility];  Service: Endoscopy;  Laterality: N/A;  . PACEMAKER IMPLANT N/A 07/27/2019   Procedure: PACEMAKER IMPLANT;  Surgeon: Marinus Maw, MD;  Location: [facility];  Service: Cardiovascular;  Laterality: N/A;  . TEMPORARY PACEMAKER N/A 07/26/2019   Procedure: TEMPORARY PACEMAKER;  Surgeon: Kathleene Hazel, MD;  Location: [facility];  Service: Cardiovascular;  Laterality: N/A;    Current Outpatient  Medications  Medication Sig Dispense Refill  . allopurinol (ZYLOPRIM) 300 MG tablet TAKE ONE TABLET BY MOUTH EVERY DAY (Patient taking differently: Take 300 mg by mouth daily. ) 30 tablet 5  . aspirin EC 81 MG tablet Take 1 tablet (81 mg total) by mouth daily. 90 tablet 3  . Cholecalciferol (VITAMIN D3) 10 MCG (400 UNIT) CHEW Chew 10 mcg by mouth daily.    Marland Kitchen levothyroxine (SYNTHROID, LEVOTHROID) 25 MCG tablet Take 25 mcg by mouth daily.    Marland Kitchen lisinopril-hydrochlorothiazide (PRINZIDE,ZESTORETIC) 20-12.5 MG per tablet Take 1 tablet by mouth daily.      No current facility-administered medications for this visit.     Allergies:   Patient has no known allergies.   Social History: Social History   Socioeconomic History  . Marital status: Married    Spouse name: Not on file  . Number of children: 2  . Years of education: Not on file  . Highest education level: Not on file  Occupational History  . Occupation: retired  Engineer, production  . Financial resource strain: Not on file  . Food insecurity    Worry: Not on file    Inability: Not on file  . Transportation needs    Medical: Not on file    Non-medical: Not on file  Tobacco Use  . Smoking status: Former Games developer  . Smokeless tobacco: Current User    Types: Chew  Substance and Sexual Activity  . Alcohol use: No  . Drug use: No  . Sexual activity: Not on file  Lifestyle  . Physical activity  Days per week: Not on file    Minutes per session: Not on file  . Stress: Not on file  Relationships  . Social Herbalist on phone: Not on file    Gets together: Not on file    Attends religious service: Not on file    Active member of club or organization: Not on file    Attends meetings of clubs or organizations: Not on file    Relationship status: Not on file  . Intimate partner violence    Fear of current or ex partner: Not on file    Emotionally abused: Not on file    Physically abused: Not on file    Forced sexual  activity: Not on file  Other Topics Concern  . Not on file  Social History Narrative  . Not on file    Family History: Family History  Problem Relation Age of Onset  . CAD Mother   . Cancer Father   . Heart disease Other   . Hypertension Other   . Cancer Other      Review of Systems: All other systems reviewed and are otherwise negative except as noted above.  Physical Exam: Vitals:   08/09/19 1329  BP: 128/72  Pulse: (!) 48  Weight: 177 lb (80.3 kg)  Height: 5\' 10"  (1.778 m)     GEN- The patient is well appearing, alert and oriented x 3 today.   HEENT: normocephalic, atraumatic; sclera clear, conjunctiva pink; hearing intact; oropharynx clear; neck supple  Lungs- Clear to ausculation bilaterally, normal work of breathing.  No wheezes, rales, rhonchi Heart- Regular rate and rhythm, no murmurs, rubs or gallops  GI- soft, non-tender, non-distended, bowel sounds present  Extremities- no clubbing, cyanosis, or edema  MS- no significant deformity or atrophy Skin- warm and dry, no rash or lesion; PPM pocket well healed Psych- euthymic mood, full affect Neuro- strength and sensation are intact  PPM Interrogation- reviewed in detail today,  See PACEART report  EKG:  EKG is not ordered today.  Recent Labs: 07/26/2019: TSH 1.876 07/27/2019: BUN 24; Creatinine, Ser 1.93; Hemoglobin 12.3; Platelets 184; Potassium 4.2; Sodium 140   Wt Readings from Last 3 Encounters:  08/09/19 177 lb (80.3 kg)  07/28/19 171 lb 9.6 oz (77.8 kg)  10/03/18 179 lb (81.2 kg)     Other studies Reviewed: Additional studies/ records that were reviewed today include: Echo 07/28/2019 shows LVEF 35-40%, EP implant not, Most recent labwork.   Assessment and Plan:  1.  Symptomatic bradycardia due to CHB s/p St. Jude PPM  Normal PPM function See Claudia Desanctis Art report Steri strips removed today. Site stable and well healed. Home transmitter communicating.   2. Chronic systolic CHF EF 96-28% but this was  day after PPM placement due to urgent nature.  Will adjust medications and repeat echo with GDMT and PPM in setting of CHB.  Stop lisinopril/HCTz Start coreg 3.125 mg BID Start losartan 50 mg daily Start lasix 20 mg AS NEEDED for peripheral edema Would not move to cardiac cath at this time. Would follow along with GDMT in the setting of his acute illness and repeat ECHO in 3-4 months. Consider cath if renal functional would tolerate and if EF does not improve.   3. CKD III AKI in hospital likely in setting of CHB and rates in the 10-20s. BMET today.   4. Atrial tachycardia/?AF Noted on device interrogation today. One 10 minute episode, one 6 second episode, both on the  day he would've left the hospital.  Follow for now. SCAF if true AF, so would not OAC at this time.   Current medicines are reviewed at length with the patient today.   The patient does not have concerns regarding his medicines.   Labs/ tests ordered today include:  Orders Placed This Encounter  Procedures  . Basic metabolic panel  . CUP PACEART INCLINIC DEVICE CHECK    Disposition:   Follow up with [name] for 91 day check. Will have see [facility]   Signed, Graciella Freer, PA-C  08/09/2019 1:34 PM  [facility] [address] [place] [address] [phone] (office) [phone] (fax)

## 2019-08-10 ENCOUNTER — Telehealth: Payer: Self-pay

## 2019-08-10 DIAGNOSIS — I10 Essential (primary) hypertension: Secondary | ICD-10-CM

## 2019-08-10 DIAGNOSIS — I5022 Chronic systolic (congestive) heart failure: Secondary | ICD-10-CM

## 2019-08-10 DIAGNOSIS — Z79899 Other long term (current) drug therapy: Secondary | ICD-10-CM

## 2019-08-10 LAB — BASIC METABOLIC PANEL
BUN/Creatinine Ratio: 16 (ref 10–24)
BUN: 24 mg/dL (ref 8–27)
CO2: 22 mmol/L (ref 20–29)
Calcium: 9.2 mg/dL (ref 8.6–10.2)
Chloride: 105 mmol/L (ref 96–106)
Creatinine, Ser: 1.5 mg/dL — ABNORMAL HIGH (ref 0.76–1.27)
GFR calc Af Amer: 49 mL/min/{1.73_m2} — ABNORMAL LOW (ref >59)
GFR calc non Af Amer: 43 mL/min/{1.73_m2} — ABNORMAL LOW (ref >59)
Glucose: 70 mg/dL (ref 65–99)
Potassium: 4.9 mmol/L (ref 3.5–5.2)
Sodium: 138 mmol/L (ref 134–144)

## 2019-08-10 NOTE — Telephone Encounter (Signed)
Pt verbalized understanding of his lab results and will return 08/20/19 for repeat labs.

## 2019-08-13 ENCOUNTER — Encounter (HOSPITAL_COMMUNITY): Payer: Self-pay | Admitting: Internal Medicine

## 2019-08-20 ENCOUNTER — Other Ambulatory Visit: Payer: Medicare HMO | Admitting: *Deleted

## 2019-08-20 ENCOUNTER — Other Ambulatory Visit: Payer: Self-pay

## 2019-08-20 DIAGNOSIS — I5022 Chronic systolic (congestive) heart failure: Secondary | ICD-10-CM

## 2019-08-20 DIAGNOSIS — Z79899 Other long term (current) drug therapy: Secondary | ICD-10-CM

## 2019-08-20 DIAGNOSIS — I10 Essential (primary) hypertension: Secondary | ICD-10-CM

## 2019-08-20 LAB — BASIC METABOLIC PANEL
BUN/Creatinine Ratio: 13 (ref 10–24)
BUN: 19 mg/dL (ref 8–27)
CO2: 18 mmol/L — ABNORMAL LOW (ref 20–29)
Calcium: 9 mg/dL (ref 8.6–10.2)
Chloride: 105 mmol/L (ref 96–106)
Creatinine, Ser: 1.51 mg/dL — ABNORMAL HIGH (ref 0.76–1.27)
GFR calc Af Amer: 49 mL/min/{1.73_m2} — ABNORMAL LOW (ref >59)
GFR calc non Af Amer: 42 mL/min/{1.73_m2} — ABNORMAL LOW (ref >59)
Glucose: 79 mg/dL (ref 65–99)
Potassium: 4.3 mmol/L (ref 3.5–5.2)
Sodium: 142 mmol/L (ref 134–144)

## 2019-09-07 ENCOUNTER — Telehealth: Payer: Self-pay | Admitting: Interventional Cardiology

## 2019-09-07 NOTE — Telephone Encounter (Signed)
New message     Called to confirm appt for Monday.  Patient said that he just had a pacemaker put in and wanted his son to accompany him to the appt on Monday because he is not "steady on his feet".  Is this ok?

## 2019-09-07 NOTE — Telephone Encounter (Signed)
Spoke with pt and made him aware ok for son to accompany

## 2019-09-08 NOTE — Progress Notes (Signed)
Cardiology Office Note:    Date:  09/10/2019   ID:  Steven Bailey Sr., DOB 12-May-1935, MRN [id number]  PCP:  Richmond Campbell., PA-C  Cardiologist:  Lesleigh Noe, MD   Referring MD: Richmond Campbell., PA-C   Chief Complaint  Patient presents with  . Advice Only  . Congestive Heart Failure    History of Present Illness:    Steven Krueger. is a 84 y.o. male with a hx of trifascicular block, frequent PVCs, hypertension, prior stroke, stage III chronic kidney disease, and high grade AV block which was treated with PPM therapy after syncope following several years of follow-up.  Steven Krueger is doing well.  His major concern is when he can start driving again.  This was not answered at the last office visit.  He denies lower extremity swelling, orthopnea, PND, dizziness, and recurrent syncope.  Medication changes were made at her last office visit to accommodate decreased LV function.  Change was not executed as the patient did not realize adjustments were made.  Past Medical History:  Diagnosis Date  . Chronic kidney disease, stage 3 04/01/2014  . Complete heart block (HCC) 07/26/2019   temp wire placed  . CVA, old, hemiparesis (HCC) 04/01/2014  . Essential hypertension 04/01/2014  . Hyperlipidemia   . Hypothyroidism    takes Synthroid daily  . Kidney stones   . Osteoarthritis of right knee 04/01/2014  . Pacemaker 07/27/2019   St Jude dual chamber  . 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 Ventricular bigeminy 06/27/2014    Past Surgical History:  Procedure Laterality Date  . appendectomy    . COLONOSCOPY WITH PROPOFOL N/A 04/09/2016   Procedure: COLONOSCOPY WITH PROPOFOL;  Surgeon: [name], MD;  Location: [facility];  Service: Endoscopy;  Laterality: N/A;  . PACEMAKER IMPLANT N/A 07/27/2019   Procedure: PACEMAKER IMPLANT;  Surgeon:  Marinus Maw, MD;  Location: [facility];  Service: Cardiovascular;  Laterality: N/A;  . TEMPORARY PACEMAKER N/A 07/26/2019   Procedure: TEMPORARY PACEMAKER;  Surgeon: Kathleene Hazel, MD;  Location: [facility];  Service: Cardiovascular;  Laterality: N/A;    Current Medications: Current Meds  Medication Sig  . allopurinol (ZYLOPRIM) 300 MG tablet TAKE ONE TABLET BY MOUTH EVERY DAY  . aspirin EC 81 MG tablet Take 1 tablet (81 mg total) by mouth daily.  . Cholecalciferol (VITAMIN D3) 10 MCG (400 UNIT) CHEW Chew 10 mcg by mouth daily.  Marland Kitchen levothyroxine (SYNTHROID, LEVOTHROID) 25 MCG tablet Take 25 mcg by mouth daily.  . [DISCONTINUED] lisinopril-hydrochlorothiazide (ZESTORETIC) 20-12.5 MG tablet Take 1 tablet by mouth daily.     Allergies:   Patient has no known allergies.   Social History   Socioeconomic History  . Marital status: Married    Spouse name: Not on file  . Number of children: 2  . Years of education: Not on file  . Highest education level: Not on file  Occupational History  . Occupation: retired  Engineer, production  . Financial resource strain: Not on file  . Food insecurity    Worry: Not on file    Inability: Not on file  . Transportation needs    Medical: Not on file    Non-medical: Not on file  Tobacco Use  . Smoking status: Former Games developer  . Smokeless tobacco: Current User  Types: Chew  Substance and Sexual Activity  . Alcohol use: No  . Drug use: No  . Sexual activity: Not on file  Lifestyle  . Physical activity    Days per week: Not on file    Minutes per session: Not on file  . Stress: Not on file  Relationships  . Social Herbalist on phone: Not on file    Gets together: Not on file    Attends religious service: Not on file    Active member of club or organization: Not on file    Attends meetings of clubs or organizations: Not on file    Relationship status: Not on file  Other Topics Concern  . Not on file   Social History Narrative  . Not on file     Family History: The patient's family history includes CAD in his mother; Cancer in his father and another family member; Heart disease in an other family member; Hypertension in an other family member.  ROS:   Please see the history of present illness.    No complaints.  No chills or fever.  All other systems reviewed and are negative.  EKGs/Labs/Other Studies Reviewed:    The following studies were reviewed today: 2D Doppler echocardiogram July 28, 2019, IMPRESSIONS    1. Left ventricular ejection fraction, by visual estimation, is 35 to 40%. The left ventricle has moderately decreased function. Normal left ventricular size. There is no left ventricular hypertrophy.  2. Definity contrast agent was given IV to delineate the left ventricular endocardial borders.  3. Left ventricular diastolic function could not be evaluated pattern of LV diastolic filling.  4. Global right ventricle has normal systolic function.The right ventricular size is normal.  5. Left atrial size was normal.  6. Right atrial size was normal.  7. The mitral valve is normal in structure. Mild mitral valve regurgitation. No evidence of mitral stenosis.  8. The tricuspid valve is normal in structure. Tricuspid valve regurgitation is trivial.  9. The aortic valve is normal in structure. Aortic valve regurgitation is mild by color flow Doppler. Structurally normal aortic valve, with no evidence of sclerosis or stenosis. 10. The pulmonic valve was normal in structure. Pulmonic valve regurgitation is not visualized by color flow Doppler. 11. Apical akinesis with overall moderate LV dysfunction; no thrombus noted using definity; mild AI and MR.  EKG:  EKG not repeated  Recent Labs: 07/26/2019: TSH 1.876 07/27/2019: Hemoglobin 12.3; Platelets 184 08/20/2019: BUN 19; Creatinine, Ser 1.51; Potassium 4.3; Sodium 142  Recent Lipid Panel    Component Value Date/Time   CHOL   10/30/2009 0604    143        ATP III CLASSIFICATION:  <200     mg/dL   Desirable  200-239  mg/dL   Borderline High  >=240    mg/dL   High          TRIG 73 10/30/2009 0604   HDL 24 (L) 10/30/2009 0604   CHOLHDL 6.0 10/30/2009 0604   VLDL 15 10/30/2009 0604   LDLCALC (H) 10/30/2009 0604    104        Total Cholesterol/HDL:CHD Risk Coronary Heart Disease Risk Table                     Men   Women  1/2 Average Risk   3.4   3.3  Average Risk       5.0   4.4  2 X  Average Risk   9.6   7.1  3 X Average Risk  23.4   11.0        Use the calculated Patient Ratio above and the CHD Risk Table to determine the patient's CHD Risk.        ATP III CLASSIFICATION (LDL):  <100     mg/dL   Optimal  161-096100-129  mg/dL   Near or Above                    Optimal  130-159  mg/dL   Borderline  [id number]  mg/dL   High  >811>190     mg/dL   Very High    Physical Exam:    VS:  BP 132/84   Pulse 79   Ht 5\' 10"  (1.778 m)   Wt 173 lb 12.8 oz (78.8 kg)   SpO2 96%   BMI 24.94 kg/m     Wt Readings from Last 3 Encounters:  09/10/19 173 lb 12.8 oz (78.8 kg)  08/09/19 177 lb (80.3 kg)  07/28/19 171 lb 9.6 oz (77.8 kg)     GEN: Slender. No acute distress HEENT: Normal NECK: No JVD. LYMPHATICS: No lymphadenopathy CARDIAC:  RRR without murmur, gallop, or edema. VASCULAR:  Normal Pulses. No bruits. RESPIRATORY:  Clear to auscultation without rales, wheezing or rhonchi.  Pacemaker pocket is unremarkable. ABDOMEN: Soft, non-tender, non-distended, No pulsatile mass, MUSCULOSKELETAL: No deformity  SKIN: Warm and dry NEUROLOGIC:  Alert and oriented x 3 PSYCHIATRIC:  Normal affect   ASSESSMENT:    1. AV block, 3rd degree (HCC)   2. Presence of permanent cardiac pacemaker   3. PVC's (premature ventricular contractions)   4. Chronic systolic CHF (congestive heart failure) (HCC)   5. Essential hypertension   6. Educated about COVID-19 virus infection    PLAN:    In order of problems listed above:   1. Normally functioning DDD pacemaker for high-grade AV block. 2. As above 3. Not discussed 4. He is here today to follow-up medication adjustments for systolic heart failure, asymptomatic.  The changes were not made due to a mixup at checkout for that particular office visit.  Changes made today are to switch to losartan 50/12.5 mg/day and add carvedilol 3.125 mg twice daily.  Lisinopril HCTZ is discontinued.  Consider discontinuation of HCTZ and adding spironolactone but kidney function is borderline with creatinine of 1.5.  On return visit he will need to have a basic metabolic panel performed.  I will plan to see him in 6 months.   Medication Adjustments/Labs and Tests Ordered: Current medicines are reviewed at length with the patient today.  Concerns regarding medicines are outlined above.  Orders Placed This Encounter  Procedures  . Basic metabolic panel   Meds ordered this encounter  Medications  . DISCONTD: carvedilol (COREG) 3.125 MG tablet    Sig: Take 1 tablet (3.125 mg total) by mouth 2 (two) times daily.    Dispense:  180 tablet    Refill:  3  . DISCONTD: losartan-hydrochlorothiazide (HYZAAR) 50-12.5 MG tablet    Sig: Take 1 tablet by mouth daily.    Dispense:  90 tablet    Refill:  3    D/c Lisinopril/HCT  . losartan-hydrochlorothiazide (HYZAAR) 50-12.5 MG tablet    Sig: Take 1 tablet by mouth daily.    Dispense:  90 tablet    Refill:  3    D/c Lisinopril/HCT  . carvedilol (COREG) 3.125 MG tablet    Sig:  Take 1 tablet (3.125 mg total) by mouth 2 (two) times daily.    Dispense:  180 tablet    Refill:  3    Patient Instructions  Medication Instructions:  1) DISCONTINUE Lisniopril/HCTZ 20/12.5mg  once daily 2) START Losartan/HCTZ 50/12.5mg  once daily 2) START Carvedilol 3.125mg  twice daily  *If you need a refill on your cardiac medications before your next appointment, please call your pharmacy*  Lab Work: BMET in 2 weeks  If you have labs (blood work) drawn  today and your tests are completely normal, you will receive your results only by: Marland Kitchen MyChart Message (if you have MyChart) OR . A paper copy in the mail If you have any lab test that is abnormal or we need to change your treatment, we will call you to review the results.  Testing/Procedures: None  Follow-Up: At [facility], you and your health needs are our priority.  As part of our continuing mission to provide you with exceptional heart care, we have created designated Provider Care Teams.  These Care Teams include your primary Cardiologist (physician) and Advanced Practice Providers (APPs -  Physician Assistants and Nurse Practitioners) who all work together to provide you with the care you need, when you need it.  Your next appointment:   2 week(s)  The format for your next appointment:   In Person  Provider:   You may see Lesleigh Noe, MD or one of the following Advanced Practice Providers on your designated Care Team:    Norma Fredrickson, NP  Nada Boozer, NP  Georgie Chard, NP   Other Instructions      Signed, Lesleigh Noe, MD  09/10/2019 10:42 AM    [facility]

## 2019-09-10 ENCOUNTER — Other Ambulatory Visit: Payer: Self-pay

## 2019-09-10 ENCOUNTER — Encounter: Payer: Self-pay | Admitting: Interventional Cardiology

## 2019-09-10 ENCOUNTER — Ambulatory Visit (INDEPENDENT_AMBULATORY_CARE_PROVIDER_SITE_OTHER): Payer: Medicare HMO | Admitting: Interventional Cardiology

## 2019-09-10 VITALS — BP 132/84 | HR 79 | Ht 70.0 in | Wt 173.8 lb

## 2019-09-10 DIAGNOSIS — I5022 Chronic systolic (congestive) heart failure: Secondary | ICD-10-CM | POA: Diagnosis not present

## 2019-09-10 DIAGNOSIS — I110 Hypertensive heart disease with heart failure: Secondary | ICD-10-CM

## 2019-09-10 DIAGNOSIS — I442 Atrioventricular block, complete: Secondary | ICD-10-CM

## 2019-09-10 DIAGNOSIS — I10 Essential (primary) hypertension: Secondary | ICD-10-CM

## 2019-09-10 DIAGNOSIS — I493 Ventricular premature depolarization: Secondary | ICD-10-CM | POA: Diagnosis not present

## 2019-09-10 DIAGNOSIS — Z950 Presence of cardiac pacemaker: Secondary | ICD-10-CM

## 2019-09-10 DIAGNOSIS — Z7189 Other specified counseling: Secondary | ICD-10-CM

## 2019-09-10 MED ORDER — LOSARTAN POTASSIUM-HCTZ 50-12.5 MG PO TABS: 1.0000 | ORAL_TABLET | Freq: Every day | ORAL | 3 refills | Status: DC

## 2019-09-10 MED ORDER — CARVEDILOL 3.125 MG PO TABS: 3.1250 mg | ORAL_TABLET | Freq: Two times a day (BID) | ORAL | 3 refills | Status: DC

## 2019-09-10 NOTE — Patient Instructions (Addendum)
Medication Instructions:  1) DISCONTINUE Lisniopril/HCTZ 20/12.5mg  once daily 2) START Losartan/HCTZ 50/12.5mg  once daily 2) START Carvedilol 3.125mg  twice daily  *If you need a refill on your cardiac medications before your next appointment, please call your pharmacy*  Lab Work: BMET in 2 weeks  If you have labs (blood work) drawn today and your tests are completely normal, you will receive your results only by: Marland Kitchen MyChart Message (if you have MyChart) OR . A paper copy in the mail If you have any lab test that is abnormal or we need to change your treatment, we will call you to review the results.  Testing/Procedures: None  Follow-Up: At [facility], you and your health needs are our priority.  As part of our continuing mission to provide you with exceptional heart care, we have created designated Provider Care Teams.  These Care Teams include your primary Cardiologist (physician) and Advanced Practice Providers (APPs -  Physician Assistants and Nurse Practitioners) who all work together to provide you with the care you need, when you need it.  Your next appointment:   2 week(s)  The format for your next appointment:   In Person  Provider:   You may see Sinclair Grooms, MD or one of the following Advanced Practice Providers on your designated Care Team:    Truitt Merle, NP  Cecilie Kicks, NP  Kathyrn Drown, NP   Other Instructions

## 2019-09-20 NOTE — Progress Notes (Signed)
CARDIOLOGY OFFICE NOTE  Date:  09/24/2019    [name] Date of Birth: [birth year] Medical Record #[id number]  PCP:  Aletha Halim., PA-C  Cardiologist:  Tamala Julian   Chief Complaint  Patient presents with  . Follow-up    Seen for Dr. Tamala Julian    History of Present Illness: Steven Krueger. is a 84 y.o. male who presents today for a 3 week check. Seen for Dr. Tamala Julian. He sees Dr. Curt Bears for EP.   He has a history of trifascicular block with subsequent syncope followed by PPM implant in October 2020, frequent PVCs, hypertension, prior stroke, stage III chronic kidney disease and chronic systolic HF. Echo from 07/2019 with EF of 35 to 40%.   Seen by Dr. Tamala Julian back before Thanksgiving - there was discussion about resuming driving. He had previously had med changes due to his LV dysfunction but the patient did not make those changes - this was straightened out at last visit -now on ARB with his HCTZ and low dose Coreg.    The patient does not have symptoms concerning for COVID-19 infection (fever, chills, cough, or new shortness of breath).   Comes in today. Here alone. Feels ok. No chest pain. Breathing is good. He has had lab today. He has chewing tobacco in his back pocket. He is not driving. He wants to get up some leaves - I think this would be ok. Weight is good. He feels like he is doing well.   Past Medical History:  Diagnosis Date  . Chronic kidney disease, stage 3 04/01/2014  . Complete heart block ([place]) 07/26/2019   temp wire placed  . CVA, old, hemiparesis ([place]) 04/01/2014  . Essential hypertension 04/01/2014  . Hyperlipidemia   . Hypothyroidism    takes Synthroid daily  . Kidney stones   . Osteoarthritis of right knee 04/01/2014  . Pacemaker 07/27/2019   St Jude dual chamber  . 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 Ventricular  bigeminy 06/27/2014    Past Surgical History:  Procedure Laterality Date  . appendectomy    . COLONOSCOPY WITH PROPOFOL N/A 04/09/2016   Procedure: COLONOSCOPY WITH PROPOFOL;  Surgeon: Carol Ada, MD;  Location: [facility];  Service: Endoscopy;  Laterality: N/A;  . PACEMAKER IMPLANT N/A 07/27/2019   Procedure: PACEMAKER IMPLANT;  Surgeon: Evans Lance, MD;  Location: [place] [facility];  Service: Cardiovascular;  Laterality: N/A;  . TEMPORARY PACEMAKER N/A 07/26/2019   Procedure: TEMPORARY PACEMAKER;  Surgeon: Burnell Blanks, MD;  Location: [place] [facility];  Service: Cardiovascular;  Laterality: N/A;     Medications: Current Meds  Medication Sig  . allopurinol (ZYLOPRIM) 300 MG tablet TAKE ONE TABLET BY MOUTH EVERY DAY  . aspirin EC 81 MG tablet Take 1 tablet (81 mg total) by mouth daily.  . Cholecalciferol (VITAMIN D3) 10 MCG (400 UNIT) CHEW Chew 10 mcg by mouth daily.  Marland Kitchen levothyroxine (SYNTHROID, LEVOTHROID) 25 MCG tablet Take 25 mcg by mouth daily.  Marland Kitchen losartan-hydrochlorothiazide (HYZAAR) 50-12.5 MG tablet Take 1 tablet by mouth daily.  . [DISCONTINUED] carvedilol (COREG) 3.125 MG tablet Take 1 tablet (3.125 mg total) by mouth 2 (two) times daily.     Allergies: No Known Allergies  Social History: The patient  reports that he has quit smoking. His smokeless tobacco use includes chew. He reports  that he does not drink alcohol or use drugs.   Family History: The patient's family history includes CAD in his mother; Cancer in his father and another family member; Heart disease in an other family member; Hypertension in an other family member.   Review of Systems: Please see the history of present illness.   All other systems are reviewed and negative.   Physical Exam: VS:  BP 130/80   Pulse 74   Ht 5\' 10"  (1.778 m)   Wt 174 lb (78.9 kg)   SpO2 98%   BMI 24.97 kg/m  .  BMI Body mass index is 24.97 kg/m.  Wt Readings from Last 3 Encounters:  09/24/19 174  lb (78.9 kg)  09/10/19 173 lb 12.8 oz (78.8 kg)  08/09/19 177 lb (80.3 kg)    General: Pleasant. Elderly. Alert and in no acute distress.   HEENT: Normal.  Neck: Supple, no JVD, carotid bruits, or masses noted.  Cardiac: Regular rate and rhythm. Heart tones are distant. No edema.  Respiratory:  Lungs are clear to auscultation bilaterally with normal work of breathing.  GI: Soft and nontender.  MS: No deformity or atrophy. Gait and ROM intact.  Skin: Warm and dry. Color is normal.  Neuro:  Strength and sensation are intact and no gross focal deficits noted.  Psych: Alert, appropriate and with normal affect.   LABORATORY DATA:  EKG:  EKG is not ordered today.   Lab Results  Component Value Date   WBC 5.0 07/27/2019   HGB 12.3 (L) 07/27/2019   HCT 37.4 (L) 07/27/2019   PLT 184 07/27/2019   GLUCOSE 79 08/20/2019   CHOL  10/30/2009    143        ATP III CLASSIFICATION:  <200     mg/dL   Desirable  383-291  mg/dL   Borderline High  >=916    mg/dL   High          TRIG 73 10/30/2009   HDL 24 (L) 10/30/2009   LDLCALC (H) 10/30/2009    104        Total Cholesterol/HDL:CHD Risk Coronary Heart Disease Risk Table                     Men   Women  1/2 Average Risk   3.4   3.3  Average Risk       5.0   4.4  2 X Average Risk   9.6   7.1  3 X Average Risk  23.4   11.0        Use the calculated Patient Ratio above and the CHD Risk Table to determine the patient's CHD Risk.        ATP III CLASSIFICATION (LDL):  <100     mg/dL   Optimal  606-004  mg/dL   Near or Above                    Optimal  130-159  mg/dL   Borderline  599-774  mg/dL   High  >142     mg/dL   Very High   ALT 14 (L) 08/13/2017   AST 16 08/13/2017   NA 142 08/20/2019   K 4.3 08/20/2019   CL 105 08/20/2019   CREATININE 1.51 (H) 08/20/2019   BUN 19 08/20/2019   CO2 18 (L) 08/20/2019   TSH 1.876 07/26/2019   INR 1.10 10/29/2009   HGBA1C  10/30/2009    5.7 (NOTE) The  ADA recommends the following  therapeutic goal for glycemic control related to Hgb A1c measurement: Goal of therapy: <6.5 Hgb A1c  Reference: American Diabetes Association: Clinical Practice Recommendations 2010, Diabetes Care, 2010, 33: (Suppl  1).     BNP (last 3 results) No results for input(s): BNP in the last 8760 hours.  ProBNP (last 3 results) No results for input(s): PROBNP in the last 8760 hours.   Other Studies Reviewed Today:  2D Doppler echocardiogram July 28, 2019, IMPRESSIONS  1. Left ventricular ejection fraction, by visual estimation, is 35 to 40%. The left ventricle has moderately decreased function. Normal left ventricular size. There is no left ventricular hypertrophy. 2. Definity contrast agent was given IV to delineate the left ventricular endocardial borders. 3. Left ventricular diastolic function could not be evaluated pattern of LV diastolic filling. 4. Global right ventricle has normal systolic function.The right ventricular size is normal. 5. Left atrial size was normal. 6. Right atrial size was normal. 7. The mitral valve is normal in structure. Mild mitral valve regurgitation. No evidence of mitral stenosis. 8. The tricuspid valve is normal in structure. Tricuspid valve regurgitation is trivial. 9. The aortic valve is normal in structure. Aortic valve regurgitation is mild by color flow Doppler. Structurally normal aortic valve, with no evidence of sclerosis or stenosis. 10. The pulmonic valve was normal in structure. Pulmonic valve regurgitation is not visualized by color flow Doppler. 11. Apical akinesis with overall moderate LV dysfunction; no thrombus noted using definity; mild AI and MR.    ASSESSMENT & PLAN:    1. Prior AV block/CHB - subsequent syncope -now with PPM in place - this was done back in October - occurred during driving - follows with EP.   2. PVC - does not seem to be an issue at this time - now on low dose Coreg - has PPM in place - increasing  this dose today.   3. Chronic systolic HF - he has been changed over to ARB therapy with his HCTZ. Now on Coreg as well. There has been concern about his kidney function - will recheck BMET today - I am going to increase the Coreg - will hold on Aldactone until seen back and review of his labs.   4. HTN - BP is ok - still with room to titrate medicines.   5. CKD - rechecking lab today.   6. COVID-19 Education: The signs and symptoms of COVID-19 were discussed with the patient and how to seek care for testing (follow up with PCP or arrange E-visit).  The importance of social distancing, staying at home, hand hygiene and wearing a mask when out in public were discussed today.  Current medicines are reviewed with the patient today.  The patient does not have concerns regarding medicines other than what has been noted above.  The following changes have been made:  See above.  Labs/ tests ordered today include:    Orders Placed This Encounter  Procedures  . Basic metabolic panel     Disposition:   FU with [place] in about 6 weeks. Lab today. Increasing Coreg today.  Has EP follow up planned.    Patient is agreeable to this plan and will call if any problems develop in the interim.   SignedNorma Fredrickson, NP  09/24/2019 1:30 PM  [facility] [address] [place], [place]  [address] Phone: [phone] Fax: [phone]

## 2019-09-24 ENCOUNTER — Encounter: Payer: Self-pay | Admitting: Nurse Practitioner

## 2019-09-24 ENCOUNTER — Other Ambulatory Visit: Payer: Medicare HMO | Admitting: *Deleted

## 2019-09-24 ENCOUNTER — Ambulatory Visit (INDEPENDENT_AMBULATORY_CARE_PROVIDER_SITE_OTHER): Payer: Medicare HMO | Admitting: Nurse Practitioner

## 2019-09-24 ENCOUNTER — Other Ambulatory Visit: Payer: Self-pay

## 2019-09-24 VITALS — BP 130/80 | HR 74 | Ht 70.0 in | Wt 174.0 lb

## 2019-09-24 DIAGNOSIS — I5022 Chronic systolic (congestive) heart failure: Secondary | ICD-10-CM

## 2019-09-24 DIAGNOSIS — Z950 Presence of cardiac pacemaker: Secondary | ICD-10-CM | POA: Diagnosis not present

## 2019-09-24 DIAGNOSIS — I10 Essential (primary) hypertension: Secondary | ICD-10-CM | POA: Diagnosis not present

## 2019-09-24 DIAGNOSIS — Z7189 Other specified counseling: Secondary | ICD-10-CM | POA: Diagnosis not present

## 2019-09-24 DIAGNOSIS — I519 Heart disease, unspecified: Secondary | ICD-10-CM

## 2019-09-24 MED ORDER — CARVEDILOL 6.25 MG PO TABS: 6.2500 mg | ORAL_TABLET | Freq: Two times a day (BID) | ORAL | 3 refills | Status: DC

## 2019-09-24 NOTE — Patient Instructions (Addendum)
After Visit Summary:  We will be checking the following labs today - BMET   Medication Instructions:    Continue with your current medicines. BUT  I would like to increase the Coreg to 6.25 mg twice a day - you can take 2 of the 3.125 mg twice a day use those up - the RX for the 6.25 mg is at your pharmacy.    If you need a refill on your cardiac medications before your next appointment, please call your pharmacy.     Testing/Procedures To Be Arranged:  N/A  Follow-Up:   See me in about 6 weeks     At [facility], you and your health needs are our priority.  As part of our continuing mission to provide you with exceptional heart care, we have created designated Provider Care Teams.  These Care Teams include your primary Cardiologist (physician) and Advanced Practice Providers (APPs -  Physician Assistants and Nurse Practitioners) who all work together to provide you with the care you need, when you need it.  Special Instructions:  . Stay safe, stay home, wash your hands for at least 20 seconds and wear a mask when out in public.  . It was good to talk with you today.    Call the [place] office at [phone] if you have any questions, problems or concerns.

## 2019-09-25 LAB — BASIC METABOLIC PANEL
BUN/Creatinine Ratio: 14 (ref 10–24)
BUN: 22 mg/dL (ref 8–27)
CO2: 20 mmol/L (ref 20–29)
Calcium: 8.7 mg/dL (ref 8.6–10.2)
Chloride: 106 mmol/L (ref 96–106)
Creatinine, Ser: 1.58 mg/dL — ABNORMAL HIGH (ref 0.76–1.27)
GFR calc Af Amer: 46 mL/min/{1.73_m2} — ABNORMAL LOW (ref >59)
GFR calc non Af Amer: 40 mL/min/{1.73_m2} — ABNORMAL LOW (ref >59)
Glucose: 105 mg/dL — ABNORMAL HIGH (ref 65–99)
Potassium: 4.2 mmol/L (ref 3.5–5.2)
Sodium: 141 mmol/L (ref 134–144)

## 2019-10-19 NOTE — Progress Notes (Deleted)
CARDIOLOGY OFFICE NOTE  Date:  10/23/2019    Steven Krueger. Date of Birth: 1935/02/08 Medical Record #[id number]  PCP:  Richmond Campbell., PA-C  Cardiologist:  Tyrone Sage & Smith/Camnitz    No chief complaint on file.   History of Present Illness: Steven Krueger. is a 85 y.o. male who presents today for a one month check.  Seen for [name]. He sees Dr. Elberta Fortis for EP.   He has a history of trifascicular block with subsequent syncope followed by PPM implant in October 2020, frequent PVCs, hypertension, prior stroke, stage III chronic kidney disease and chronic systolic HF. Echo from 07/2019 with EF of 35 to 40%.   Seen by [name] back before Thanksgiving - there was discussion about resuming driving. He had previously had med changes due to his LV dysfunction but the patient did not make those changes - this was straightened out at last visit - now on ARB with his HCTZ and low dose Coreg.    I last saw him a month ago - he was doing ok. Had chewing tobacco in his back pocket. Not driving. Cardiac status felt to be ok.   The patient {does/does not:200015} have symptoms concerning for COVID-19 infection (fever, chills, cough, or new shortness of breath).   Comes in today. Here with   Past Medical History:  Diagnosis Date  . Chronic kidney disease, stage 3 04/01/2014  . Complete heart block ([facility]) 07/26/2019   temp wire placed  . CVA, old, hemiparesis ([facility]) 04/01/2014  . Essential hypertension 04/01/2014  . Hyperlipidemia   . Hypothyroidism    takes Synthroid daily  . Kidney stones   . Osteoarthritis of right knee 04/01/2014  . Pacemaker 07/27/2019   St Jude dual chamber  . 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 Ventricular bigeminy 06/27/2014    Past Surgical History:  Procedure Laterality Date  . appendectomy    . COLONOSCOPY WITH  PROPOFOL N/A 04/09/2016   Procedure: COLONOSCOPY WITH PROPOFOL;  Surgeon: [name], MD;  Location: [facility];  Service: Endoscopy;  Laterality: N/A;  . PACEMAKER IMPLANT N/A 07/27/2019   Procedure: PACEMAKER IMPLANT;  Surgeon: Marinus Maw, MD;  [facility];  Service: Cardiovascular;  Laterality: N/A;  . TEMPORARY PACEMAKER N/A 07/26/2019   Procedure: TEMPORARY PACEMAKER;  Surgeon: Kathleene Hazel, MD;  Location: [facility];  Service: Cardiovascular;  Laterality: N/A;     Medications: No outpatient medications have been marked as taking for the 10/24/19 encounter (Appointment) with Rosalio Macadamia, NP.     Allergies: No Known Allergies  Social History: The patient  reports that he has quit smoking. His smokeless tobacco use includes chew. He reports that he does not drink alcohol or use drugs.   Family History: The patient's ***family history includes CAD in his mother; Cancer in his father and another family member; Heart disease in an other family member; Hypertension in an other family member.   Review of Systems: Please see the history of present illness.   All other systems are reviewed and negative.   Physical Exam: VS:  There were no vitals taken for this visit. Marland Kitchen  BMI There is no height or weight on file to calculate BMI.  Wt Readings from Last 3 Encounters:  09/24/19 174 lb (78.9 kg)  09/10/19  173 lb 12.8 oz (78.8 kg)  08/09/19 177 lb (80.3 kg)    General: Pleasant. Well developed, well nourished and in no acute distress.   HEENT: Normal.  Neck: Supple, no JVD, carotid bruits, or masses noted.  Cardiac: ***Regular rate and rhythm. No murmurs, rubs, or gallops. No edema.  Respiratory:  Lungs are clear to auscultation bilaterally with normal work of breathing.  GI: Soft and nontender.  MS: No deformity or atrophy. Gait and ROM intact.  Skin: Warm and dry. Color is normal.  Neuro:  Strength and sensation are intact and no gross  focal deficits noted.  Psych: Alert, appropriate and with normal affect.   LABORATORY DATA:  EKG:  EKG {ACTION; IS/IS ONG:29528413} ordered today. This demonstrates ***.  Lab Results  Component Value Date   WBC 5.0 07/27/2019   HGB 12.3 (L) 07/27/2019   HCT 37.4 (L) 07/27/2019   PLT 184 07/27/2019   GLUCOSE 105 (H) 09/24/2019   CHOL  10/30/2009    143        ATP III CLASSIFICATION:  <200     mg/dL   Desirable  200-239  mg/dL   Borderline High  >=240    mg/dL   High          TRIG 73 10/30/2009   HDL 24 (L) 10/30/2009   LDLCALC (H) 10/30/2009    104        Total Cholesterol/HDL:CHD Risk Coronary Heart Disease Risk Table                     Men   Women  1/2 Average Risk   3.4   3.3  Average Risk       5.0   4.4  2 X Average Risk   9.6   7.1  3 X Average Risk  23.4   11.0        Use the calculated Patient Ratio above and the CHD Risk Table to determine the patient's CHD Risk.        ATP III CLASSIFICATION (LDL):  <100     mg/dL   Optimal  100-129  mg/dL   Near or Above                    Optimal  130-159  mg/dL   Borderline  160-189  mg/dL   High  >190     mg/dL   Very High   ALT 14 (L) 08/13/2017   AST 16 08/13/2017   NA 141 09/24/2019   K 4.2 09/24/2019   CL 106 09/24/2019   CREATININE 1.58 (H) 09/24/2019   BUN 22 09/24/2019   CO2 20 09/24/2019   TSH 1.876 07/26/2019   INR 1.10 10/29/2009   HGBA1C  10/30/2009    5.7 (NOTE) The ADA recommends the following therapeutic goal for glycemic control related to Hgb A1c measurement: Goal of therapy: <6.5 Hgb A1c  Reference: American Diabetes Association: Clinical Practice Recommendations 2010, Diabetes Care, 2010, 33: (Suppl  1).     BNP (last 3 results) No results for input(s): BNP in the last 8760 hours.  ProBNP (last 3 results) No results for input(s): PROBNP in the last 8760 hours.   Other Studies Reviewed Today:   2D Doppler echocardiogram July 28, 2019, IMPRESSIONS  1. Left ventricular  ejection fraction, by visual estimation, is 35 to 40%. The left ventricle has moderately decreased function. Normal left ventricular size. There is no left ventricular hypertrophy. 2. Definity contrast  agent was given IV to delineate the left ventricular endocardial borders. 3. Left ventricular diastolic function could not be evaluated pattern of LV diastolic filling. 4. Global right ventricle has normal systolic function.The right ventricular size is normal. 5. Left atrial size was normal. 6. Right atrial size was normal. 7. The mitral valve is normal in structure. Mild mitral valve regurgitation. No evidence of mitral stenosis. 8. The tricuspid valve is normal in structure. Tricuspid valve regurgitation is trivial. 9. The aortic valve is normal in structure. Aortic valve regurgitation is mild by color flow Doppler. Structurally normal aortic valve, with no evidence of sclerosis or stenosis. 10. The pulmonic valve was normal in structure. Pulmonic valve regurgitation is not visualized by color flow Doppler. 11. Apical akinesis with overall moderate LV dysfunction; no thrombus noted using definity; mild AI and MR.    ASSESSMENT & PLAN:   1. Prior AV block/CHB - subsequent syncope -now with PPM in place - this was done back in October - occurred during driving - follows with EP.   2. PVC - does not seem to be an issue at this time - now on low dose Coreg - has PPM in place - increasing this dose today.   3. Chronic systolic HF - he has been changed over to ARB therapy with his HCTZ. Now on Coreg as well. There has been concern about his kidney function - will recheck BMET today - I am going to increase the Coreg - will hold on Aldactone until seen back and review of his labs.   4. HTN - BP is ok - still with room to titrate medicines.   5. CKD - rechecking lab today.    Marland Kitchen COVID-19 Education: The signs and symptoms of COVID-19 were discussed with the patient and how to  seek care for testing (follow up with PCP or arrange E-visit).  The importance of social distancing, staying at home, hand hygiene and wearing a mask when out in public were discussed today.  Current medicines are reviewed with the patient today.  The patient does not have concerns regarding medicines other than what has been noted above.  The following changes have been made:  See above.  Labs/ tests ordered today include:   No orders of the defined types were placed in this encounter.    Disposition:   FU with *** in {gen number 3-82:505397} {Days to years:10300}.   Patient is agreeable to this plan and will call if any problems develop in the interim.   SignedNorma Fredrickson, NP  10/23/2019 1:26 PM  [facility] [address] [place], [place]  [address] Phone: [phone] Fax: [phone]

## 2019-10-24 ENCOUNTER — Ambulatory Visit: Payer: Medicare HMO | Admitting: Nurse Practitioner

## 2019-10-30 ENCOUNTER — Other Ambulatory Visit: Payer: Self-pay

## 2019-10-30 ENCOUNTER — Ambulatory Visit: Payer: Medicare HMO | Admitting: Internal Medicine

## 2019-10-30 ENCOUNTER — Encounter: Payer: Self-pay | Admitting: Internal Medicine

## 2019-10-30 VITALS — BP 126/86 | HR 61 | Ht 70.0 in | Wt 181.0 lb

## 2019-10-30 DIAGNOSIS — I442 Atrioventricular block, complete: Secondary | ICD-10-CM

## 2019-10-30 DIAGNOSIS — Z950 Presence of cardiac pacemaker: Secondary | ICD-10-CM

## 2019-10-30 NOTE — Patient Instructions (Signed)

## 2019-10-30 NOTE — Progress Notes (Signed)
HPI Mr. Steven Krueger returns today for followup. He is a pleasant 85 yo man with a  H/o CHB, s/p PPM insertion. He denies chest pain or sob. No syncope. No edema. He has not had any problems with his PPM and no syncope. No Known Allergies   Current Outpatient Medications  Medication Sig Dispense Refill  . allopurinol (ZYLOPRIM) 300 MG tablet TAKE ONE TABLET BY MOUTH EVERY DAY 30 tablet 5  . aspirin EC 81 MG tablet Take 1 tablet (81 mg total) by mouth daily. 90 tablet 3  . carvedilol (COREG) 6.25 MG tablet Take 1 tablet (6.25 mg total) by mouth 2 (two) times daily. 180 tablet 3  . Cholecalciferol (VITAMIN D3) 10 MCG (400 UNIT) CHEW Chew 10 mcg by mouth daily.    Marland Kitchen levothyroxine (SYNTHROID, LEVOTHROID) 25 MCG tablet Take 25 mcg by mouth daily.    Marland Kitchen losartan-hydrochlorothiazide (HYZAAR) 50-12.5 MG tablet Take 1 tablet by mouth daily. 90 tablet 3   No current facility-administered medications for this visit.     Past Medical History:  Diagnosis Date  . Chronic kidney disease, stage 3 04/01/2014  . Complete heart block ([place]) 07/26/2019   temp wire placed  . CVA, old, hemiparesis ([place]) 04/01/2014  . Essential hypertension 04/01/2014  . Hyperlipidemia   . Hypothyroidism    takes Synthroid daily  . Kidney stones   . Osteoarthritis of right knee 04/01/2014  . Pacemaker 07/27/2019   St Jude dual chamber  . 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 Ventricular bigeminy 06/27/2014    ROS:   All systems reviewed and negative except as noted in the HPI.   Past Surgical History:  Procedure Laterality Date  . appendectomy    . COLONOSCOPY WITH PROPOFOL N/A 04/09/2016   Procedure: COLONOSCOPY WITH PROPOFOL;  Surgeon: Carol Ada, MD;  [facility];  Service: Endoscopy;  Laterality: N/A;  . PACEMAKER IMPLANT N/A 07/27/2019   Procedure: PACEMAKER  IMPLANT;  Surgeon: Evans Lance, MD;  Location: [place] [facility];  Service: Cardiovascular;  Laterality: N/A;  . TEMPORARY PACEMAKER N/A 07/26/2019   Procedure: TEMPORARY PACEMAKER;  Surgeon: Burnell Blanks, MD;  Location: [place] [facility];  Service: Cardiovascular;  Laterality: N/A;     Family History  Problem Relation Age of Onset  . CAD Mother   . Cancer Father   . Heart disease Other   . Hypertension Other   . Cancer Other      Social History   Socioeconomic History  . Marital status: Married    Spouse name: Not on file  . Number of children: 2  . Years of education: Not on file  . Highest education level: Not on file  Occupational History  . Occupation: retired  Tobacco Use  . Smoking status: Former Research scientist (life sciences)  . Smokeless tobacco: Current User    Types: Chew  Substance and Sexual Activity  . Alcohol use: No  . Drug use: No  . Sexual activity: Not on file  Other Topics Concern  . Not on file  Social History Narrative  . Not on file   Social Determinants of Health   Financial Resource Strain:   . Difficulty of Paying Living Expenses: Not on file  Food Insecurity:   . Worried About Charity fundraiser in the Last Year: Not on file  . Ran  Out of Food in the Last Year: Not on file  Transportation Needs:   . Lack of Transportation (Medical): Not on file  . Lack of Transportation (Non-Medical): Not on file  Physical Activity:   . Days of Exercise per Week: Not on file  . Minutes of Exercise per Session: Not on file  Stress:   . Feeling of Stress : Not on file  Social Connections:   . Frequency of Communication with Friends and Family: Not on file  . Frequency of Social Gatherings with Friends and Family: Not on file  . Attends Religious Services: Not on file  . Active Member of Clubs or Organizations: Not on file  . Attends Banker Meetings: Not on file  . Marital Status: Not on file  Intimate Partner Violence:   . Fear of Current  or Ex-Partner: Not on file  . Emotionally Abused: Not on file  . Physically Abused: Not on file  . Sexually Abused: Not on file     BP 126/86   Pulse 61   Ht 5\' 10"  (1.778 m)   Wt 181 lb (82.1 kg)   SpO2 96%   BMI 25.97 kg/m   Physical Exam:  Well appearing NAD HEENT: Unremarkable Neck:  No JVD, no thyromegally Lymphatics:  No adenopathy Back:  No CVA tenderness Lungs:  Clear with no wheezes HEART:  Regular rate rhythm, no murmurs, no rubs, no clicks Abd:  soft, positive bowel sounds, no organomegally, no rebound, no guarding Ext:  2 plus pulses, no edema, no cyanosis, no clubbing Skin:  No rashes no nodules Neuro:  CN II through XII intact, motor grossly intact  EKG - NSR with pacing induced LBBB  DEVICE  Normal device function.  See PaceArt for details.   Assess/Plan: 1. CHB - his is asymptomatic, s/p PPM insertion. 2. PPM - his St. Jude DDD PM is working normally.  3. HTN - his bp is well controlled. No change.  Sire Poet,MD

## 2020-02-01 ENCOUNTER — Ambulatory Visit (INDEPENDENT_AMBULATORY_CARE_PROVIDER_SITE_OTHER): Payer: Medicare HMO | Admitting: *Deleted

## 2020-02-01 DIAGNOSIS — I442 Atrioventricular block, complete: Secondary | ICD-10-CM

## 2020-02-01 LAB — CUP PACEART REMOTE DEVICE CHECK
Battery Remaining Longevity: 123 mo
Battery Remaining Percentage: 95.5 %
Battery Voltage: 3.05 V
Brady Statistic AP VP Percent: 20 %
Brady Statistic AP VS Percent: 1 %
Brady Statistic AS VP Percent: 78 %
Brady Statistic AS VS Percent: 1 %
Brady Statistic RA Percent Paced: 18 %
Brady Statistic RV Percent Paced: 98 %
Date Time Interrogation Session: 20210416104731
Implantable Lead Implant Date: 20201009
Implantable Lead Implant Date: 20201009
Implantable Lead Location: 753859
Implantable Lead Location: 753860
Implantable Pulse Generator Implant Date: 20201009
Lead Channel Impedance Value: 440 Ohm
Lead Channel Impedance Value: 610 Ohm
Lead Channel Pacing Threshold Amplitude: 0.375 V
Lead Channel Pacing Threshold Amplitude: 0.625 V
Lead Channel Pacing Threshold Pulse Width: 0.5 ms
Lead Channel Pacing Threshold Pulse Width: 0.5 ms
Lead Channel Sensing Intrinsic Amplitude: 1.5 mV
Lead Channel Sensing Intrinsic Amplitude: 12 mV
Lead Channel Setting Pacing Amplitude: 0.875
Lead Channel Setting Pacing Amplitude: 2 V
Lead Channel Setting Pacing Pulse Width: 0.5 ms
Lead Channel Setting Sensing Sensitivity: 4 mV
Pulse Gen Model: 2272
Pulse Gen Serial Number: 9166894

## 2020-02-01 NOTE — Progress Notes (Signed)
PPM Remote  

## 2020-05-02 ENCOUNTER — Ambulatory Visit (INDEPENDENT_AMBULATORY_CARE_PROVIDER_SITE_OTHER): Payer: Medicare HMO | Admitting: *Deleted

## 2020-05-02 DIAGNOSIS — I442 Atrioventricular block, complete: Secondary | ICD-10-CM

## 2020-05-02 LAB — CUP PACEART REMOTE DEVICE CHECK
Battery Remaining Longevity: 119 mo
Battery Remaining Percentage: 95.5 %
Battery Voltage: 3.05 V
Brady Statistic AP VP Percent: 20 %
Brady Statistic AP VS Percent: 1 %
Brady Statistic AS VP Percent: 76 %
Brady Statistic AS VS Percent: 1.3 %
Brady Statistic RA Percent Paced: 18 %
Brady Statistic RV Percent Paced: 97 %
Date Time Interrogation Session: 20210716105420
Implantable Lead Implant Date: 20201009
Implantable Lead Implant Date: 20201009
Implantable Lead Location: 753859
Implantable Lead Location: 753860
Implantable Pulse Generator Implant Date: 20201009
Lead Channel Impedance Value: 460 Ohm
Lead Channel Impedance Value: 580 Ohm
Lead Channel Pacing Threshold Amplitude: 0.625 V
Lead Channel Pacing Threshold Amplitude: 1.125 V
Lead Channel Pacing Threshold Pulse Width: 0.5 ms
Lead Channel Pacing Threshold Pulse Width: 0.5 ms
Lead Channel Sensing Intrinsic Amplitude: 1.5 mV
Lead Channel Sensing Intrinsic Amplitude: 7.3 mV
Lead Channel Setting Pacing Amplitude: 0.875
Lead Channel Setting Pacing Amplitude: 2 V
Lead Channel Setting Pacing Pulse Width: 0.5 ms
Lead Channel Setting Sensing Sensitivity: 4 mV
Pulse Gen Model: 2272
Pulse Gen Serial Number: 9166894

## 2020-05-06 NOTE — Progress Notes (Signed)
Remote pacemaker transmission.   

## 2020-07-01 NOTE — Progress Notes (Signed)
Cardiology Office Note:    Date:  07/02/2020   ID:  Steven Bailey Sr., DOB Nov 17, 1934, MRN [id number]  PCP:  Richmond Campbell., PA-C  Cardiologist:  Lesleigh Noe, MD   Referring MD: Richmond Campbell., PA-C   Chief Complaint  Patient presents with   Hypertension   Advice Only    History of Present Illness:    Steven Kintz. is a 85 y.o. male with a hx of trifascicular block, frequent PVCs, hypertension, prior stroke, stage III chronic kidney disease,  chronic systolic heart failure with EF 35 to 40% and high grade AV block which was treated with PPM therapy after syncope following several years of follow-up.  He feels well.  He denies orthopnea, PND, exertional dyspnea.  Has not had lower extremity swelling.  Did have right leg pain but [name] at [facility] gave him a Dosepak of prednisone which relieved the discomfort.  An ABI was also ordered.  The results are pending.  He has not had palpitations or syncope.  He denies episodes of chest pain.  Past Medical History:  Diagnosis Date   Chronic kidney disease, stage 3 04/01/2014   Complete heart block (HCC) 07/26/2019   temp wire placed   CVA, old, hemiparesis (HCC) 04/01/2014   Essential hypertension 04/01/2014   Hyperlipidemia    Hypothyroidism    takes Synthroid daily   Kidney stones    Osteoarthritis of right knee 04/01/2014   Pacemaker 07/27/2019   St Jude dual chamber   Pseudo-bradycardia 06/27/2014   Ventricular bigeminy   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   Right bundle, left anterior hemiblock, and first degree AV block, June, 2015    Ventricular bigeminy 06/27/2014    Past Surgical History:  Procedure Laterality Date   appendectomy     COLONOSCOPY WITH PROPOFOL N/A 04/09/2016   Procedure: COLONOSCOPY WITH PROPOFOL;  Surgeon: [name], MD;  Location: [facility] ENDOSCOPY;  Service: Endoscopy;  Laterality: N/A;   PACEMAKER  IMPLANT N/A 07/27/2019   Procedure: PACEMAKER IMPLANT;  Surgeon: Marinus Maw, MD;  Location: [facility] INVASIVE CV LAB;  Service: Cardiovascular;  Laterality: N/A;   TEMPORARY PACEMAKER N/A 07/26/2019   Procedure: TEMPORARY PACEMAKER;  Surgeon: Kathleene Hazel, MD;  Location: [facility] INVASIVE CV LAB;  Service: Cardiovascular;  Laterality: N/A;    Current Medications: Current Meds  Medication Sig   allopurinol (ZYLOPRIM) 300 MG tablet TAKE ONE TABLET BY MOUTH EVERY DAY   aspirin EC 81 MG tablet Take 1 tablet (81 mg total) by mouth daily.   carvedilol (COREG) 6.25 MG tablet Take 1 tablet (6.25 mg total) by mouth 2 (two) times daily.   Cholecalciferol (VITAMIN D3) 10 MCG (400 UNIT) CHEW Chew 10 mcg by mouth daily.   levothyroxine (SYNTHROID, LEVOTHROID) 25 MCG tablet Take 25 mcg by mouth daily.   losartan-hydrochlorothiazide (HYZAAR) 50-12.5 MG tablet Take 1 tablet by mouth daily.     Allergies:   Patient has no known allergies.   Social History   Socioeconomic History   Marital status: Married    Spouse name: Not on file   Number of children: 2   Years of education: Not on file   Highest education level: Not on file  Occupational History   Occupation: retired  Tobacco Use   Smoking status: Former Smoker   Smokeless tobacco: Current User    Types: Associate Professor Use: Never used  Substance  and Sexual Activity   Alcohol use: No   Drug use: No   Sexual activity: Not on file  Other Topics Concern   Not on file  Social History Narrative   Not on file   Social Determinants of Health   Financial Resource Strain:    Difficulty of Paying Living Expenses: Not on file  Food Insecurity:    Worried About Running Out of Food in the Last Year: Not on file   Ran Out of Food in the Last Year: Not on file  Transportation Needs:    Lack of Transportation (Medical): Not on file   Lack of Transportation (Non-Medical): Not on file  Physical Activity:     Days of Exercise per Week: Not on file   Minutes of Exercise per Session: Not on file  Stress:    Feeling of Stress : Not on file  Social Connections:    Frequency of Communication with Friends and Family: Not on file   Frequency of Social Gatherings with Friends and Family: Not on file   Attends Religious Services: Not on file   Active Member of Clubs or Organizations: Not on file   Attends Banker Meetings: Not on file   Marital Status: Not on file     Family History: The patient's family history includes CAD in his mother; Cancer in his father and another family member; Heart disease in an other family member; Hypertension in an other family member.  ROS:   Please see the history of present illness.    Denies particular complaints.  Compliant with his medication regimen.  Right calf pain got better with a steroid Dosepak by [name].  No recent laboratory data.  All other systems reviewed and are negative.  EKGs/Labs/Other Studies Reviewed:    The following studies were reviewed today: No new data. 2020 echocardiogram IMPRESSIONS    1. Left ventricular ejection fraction, by visual estimation, is 35 to  40%. The left ventricle has moderately decreased function. Normal left  ventricular size. There is no left ventricular hypertrophy.  2. Definity contrast agent was given IV to delineate the left ventricular  endocardial borders.  3. Left ventricular diastolic function could not be evaluated pattern of  LV diastolic filling.  4. Global right ventricle has normal systolic function.The right  ventricular size is normal.  5. Left atrial size was normal.  6. Right atrial size was normal.  7. The mitral valve is normal in structure. Mild mitral valve  regurgitation. No evidence of mitral stenosis.  8. The tricuspid valve is normal in structure. Tricuspid valve  regurgitation is trivial.  9. The aortic valve is normal in structure. Aortic  valve regurgitation is  mild by color flow Doppler. Structurally normal aortic valve, with no  evidence of sclerosis or stenosis.  10. The pulmonic valve was normal in structure. Pulmonic valve  regurgitation is not visualized by color flow Doppler.  11. Apical akinesis with overall moderate LV dysfunction; no thrombus  noted using definity; mild AI and MR.   EKG:  EKG a repeat tracing is not performed.  Tracing performed in January 2021 revealed AV sequential pacing with wide QRS complex.  There was a wide atypical left bundle branch block pattern.  Recent Labs: 07/26/2019: TSH 1.876 07/27/2019: Hemoglobin 12.3; Platelets 184 09/24/2019: BUN 22; Creatinine, Ser 1.58; Potassium 4.2; Sodium 141  Recent Lipid Panel    Component Value Date/Time   CHOL  10/30/2009 0604    143  ATP III CLASSIFICATION:  <200     mg/dL   Desirable  244-010  mg/dL   Borderline High  >=272    mg/dL   High          TRIG 73 10/30/2009 0604   HDL 24 (L) 10/30/2009 0604   CHOLHDL 6.0 10/30/2009 0604   VLDL 15 10/30/2009 0604   LDLCALC (H) 10/30/2009 0604    104        Total Cholesterol/HDL:CHD Risk Coronary Heart Disease Risk Table                     Men   Women  1/2 Average Risk   3.4   3.3  Average Risk       5.0   4.4  2 X Average Risk   9.6   7.1  3 X Average Risk  23.4   11.0        Use the calculated Patient Ratio above and the CHD Risk Table to determine the patient's CHD Risk.        ATP III CLASSIFICATION (LDL):  <100     mg/dL   Optimal  536-644  mg/dL   Near or Above                    Optimal  130-159  mg/dL   Borderline  034-742  mg/dL   High  >595     mg/dL   Very High    Physical Exam:    VS:  BP 120/78    Pulse 71    Ht 5\' 10"  (1.778 m)    Wt 178 lb 6.4 oz (80.9 kg)    SpO2 95%    BMI 25.60 kg/m     Wt Readings from Last 3 Encounters:  07/02/20 178 lb 6.4 oz (80.9 kg)  10/30/19 181 lb (82.1 kg)  09/24/19 174 lb (78.9 kg)     GEN: Elderly.  Ambulates with a cane..  No acute distress HEENT: Normal NECK: No JVD. LYMPHATICS: No lymphadenopathy CARDIAC:  RRR without murmur, gallop, or edema. VASCULAR:  Normal Pulses. No bruits. RESPIRATORY:  Clear to auscultation without rales, wheezing or rhonchi  ABDOMEN: Soft, non-tender, non-distended, No pulsatile mass, MUSCULOSKELETAL: No deformity  SKIN: Warm and dry NEUROLOGIC:  Alert and oriented x 3 PSYCHIATRIC:  Normal affect   ASSESSMENT:    1. Chronic systolic CHF (congestive heart failure) (HCC)   2. Complete heart block (HCC)   3. Essential hypertension   4. Educated about COVID-19 virus infection    PLAN:    In order of problems listed above:  1. He is on carvedilol and Hyzaar.  We will repeat echo to determine LV function.  If he is predominantly paced, reduction in LV function could be related to RV pacing.  QRS duration is very long.  May further uptitrate heart failure therapy and may have to discuss resynchronization with the EP team. 2. AV sequential pacing for complete heart block.  No recurrent syncope. 3. Blood pressure is excellently controlled. 4. He has been vaccinated and is practicing medication.  We will do basic metabolic panel today to check electrolytes and kidney function.  I may need to make further adjustment in heart failure therapy if EF remains low.  Consider resynchronization therapy.   Medication Adjustments/Labs and Tests Ordered: Current medicines are reviewed at length with the patient today.  Concerns regarding medicines are outlined above.  Orders Placed This Encounter  Procedures  Basic metabolic panel   ECHOCARDIOGRAM COMPLETE   No orders of the defined types were placed in this encounter.   Patient Instructions  Medication Instructions:  Your physician recommends that you continue on your current medications as directed. Please refer to the Current Medication list given to you today.  *If you need a refill on your cardiac medications before your next  appointment, please call your pharmacy*   Lab Work: BMET today  If you have labs (blood work) drawn today and your tests are completely normal, you will receive your results only by:  MyChart Message (if you have MyChart) OR  A paper copy in the mail If you have any lab test that is abnormal or we need to change your treatment, we will call you to review the results.   Testing/Procedures: Your physician has requested that you have an echocardiogram in October. Echocardiography is a painless test that uses sound waves to create images of your heart. It provides your doctor with information about the size and shape of your heart and how well your hearts chambers and valves are working. This procedure takes approximately one hour. There are no restrictions for this procedure.     Follow-Up: At [facility], you and your health needs are our priority.  As part of our continuing mission to provide you with exceptional heart care, we have created designated Provider Care Teams.  These Care Teams include your primary Cardiologist (physician) and Advanced Practice Providers (APPs -  Physician Assistants and Nurse Practitioners) who all work together to provide you with the care you need, when you need it.  We recommend signing up for the patient portal called "MyChart".  Sign up information is provided on this After Visit Summary.  MyChart is used to connect with patients for Virtual Visits (Telemedicine).  Patients are able to view lab/test results, encounter notes, upcoming appointments, etc.  Non-urgent messages can be sent to your provider as well.   To learn more about what you can do with MyChart, go to [email or web address].    Your next appointment:   6 month(s)  The format for your next appointment:   In Person  Provider:   You may see Lesleigh Noe, MD or one of the following Advanced Practice Providers on your designated Care Team:    Norma Fredrickson, NP  Nada Boozer,  NP  Georgie Chard, NP    Other Instructions      Signed, Lesleigh Noe, MD  07/02/2020 2:01 PM    [facility]

## 2020-07-02 ENCOUNTER — Ambulatory Visit: Payer: Medicare HMO | Admitting: Interventional Cardiology

## 2020-07-02 ENCOUNTER — Other Ambulatory Visit: Payer: Self-pay

## 2020-07-02 ENCOUNTER — Encounter: Payer: Self-pay | Admitting: Interventional Cardiology

## 2020-07-02 VITALS — BP 120/78 | HR 71 | Ht 70.0 in | Wt 178.4 lb

## 2020-07-02 DIAGNOSIS — I5022 Chronic systolic (congestive) heart failure: Secondary | ICD-10-CM

## 2020-07-02 DIAGNOSIS — I10 Essential (primary) hypertension: Secondary | ICD-10-CM

## 2020-07-02 DIAGNOSIS — I442 Atrioventricular block, complete: Secondary | ICD-10-CM

## 2020-07-02 DIAGNOSIS — Z7189 Other specified counseling: Secondary | ICD-10-CM | POA: Diagnosis not present

## 2020-07-02 NOTE — Patient Instructions (Addendum)
Medication Instructions:  Your physician recommends that you continue on your current medications as directed. Please refer to the Current Medication list given to you today.  *If you need a refill on your cardiac medications before your next appointment, please call your pharmacy*   Lab Work: BMET today  If you have labs (blood work) drawn today and your tests are completely normal, you will receive your results only by:  MyChart Message (if you have MyChart) OR  A paper copy in the mail If you have any lab test that is abnormal or we need to change your treatment, we will call you to review the results.   Testing/Procedures: Your physician has requested that you have an echocardiogram in October. Echocardiography is a painless test that uses sound waves to create images of your heart. It provides your doctor with information about the size and shape of your heart and how well your hearts chambers and valves are working. This procedure takes approximately one hour. There are no restrictions for this procedure.     Follow-Up: At [facility], you and your health needs are our priority.  As part of our continuing mission to provide you with exceptional heart care, we have created designated Provider Care Teams.  These Care Teams include your primary Cardiologist (physician) and Advanced Practice Providers (APPs -  Physician Assistants and Nurse Practitioners) who all work together to provide you with the care you need, when you need it.  We recommend signing up for the patient portal called "MyChart".  Sign up information is provided on this After Visit Summary.  MyChart is used to connect with patients for Virtual Visits (Telemedicine).  Patients are able to view lab/test results, encounter notes, upcoming appointments, etc.  Non-urgent messages can be sent to your provider as well.   To learn more about what you can do with MyChart, go to [email or web address].    Your next  appointment:   6 month(s)  The format for your next appointment:   In Person  Provider:   You may see Lesleigh Noe, MD or one of the following Advanced Practice Providers on your designated Care Team:    Norma Fredrickson, NP  Nada Boozer, NP  Georgie Chard, NP    Other Instructions

## 2020-07-03 LAB — BASIC METABOLIC PANEL
BUN/Creatinine Ratio: 11 (ref 10–24)
BUN: 18 mg/dL (ref 8–27)
CO2: 24 mmol/L (ref 20–29)
Calcium: 9.1 mg/dL (ref 8.6–10.2)
Chloride: 104 mmol/L (ref 96–106)
Creatinine, Ser: 1.57 mg/dL — ABNORMAL HIGH (ref 0.76–1.27)
GFR calc Af Amer: 46 mL/min/{1.73_m2} — ABNORMAL LOW (ref >59)
GFR calc non Af Amer: 40 mL/min/{1.73_m2} — ABNORMAL LOW (ref >59)
Glucose: 76 mg/dL (ref 65–99)
Potassium: 3.9 mmol/L (ref 3.5–5.2)
Sodium: 141 mmol/L (ref 134–144)

## 2020-07-10 ENCOUNTER — Other Ambulatory Visit: Payer: Self-pay | Admitting: Interventional Cardiology

## 2020-07-29 ENCOUNTER — Other Ambulatory Visit: Payer: Self-pay

## 2020-07-29 ENCOUNTER — Ambulatory Visit (HOSPITAL_COMMUNITY): Payer: Medicare HMO | Attending: Cardiology

## 2020-07-29 DIAGNOSIS — I5022 Chronic systolic (congestive) heart failure: Secondary | ICD-10-CM | POA: Diagnosis not present

## 2020-07-29 LAB — ECHOCARDIOGRAM COMPLETE
Area-P 1/2: 4.82 cm2
P 1/2 time: 727 msec
S' Lateral: 3.2 cm

## 2020-07-29 MED ORDER — PERFLUTREN LIPID MICROSPHERE
1.0000 mL | INTRAVENOUS | Status: AC | PRN
  Administered 2020-07-29: 2 mL via INTRAVENOUS

## 2020-08-01 ENCOUNTER — Encounter: Payer: Self-pay | Admitting: *Deleted

## 2020-08-01 ENCOUNTER — Ambulatory Visit (INDEPENDENT_AMBULATORY_CARE_PROVIDER_SITE_OTHER): Payer: Medicare HMO

## 2020-08-01 DIAGNOSIS — I442 Atrioventricular block, complete: Secondary | ICD-10-CM

## 2020-08-02 LAB — CUP PACEART REMOTE DEVICE CHECK
Battery Remaining Longevity: 115 mo
Battery Remaining Percentage: 95 %
Battery Voltage: 3.05 V
Brady Statistic AP VP Percent: 20 %
Brady Statistic AP VS Percent: 1 %
Brady Statistic AS VP Percent: 76 %
Brady Statistic AS VS Percent: 1.9 %
Brady Statistic RA Percent Paced: 18 %
Brady Statistic RV Percent Paced: 96 %
Date Time Interrogation Session: 20211015133625
Implantable Lead Implant Date: 20201009
Implantable Lead Implant Date: 20201009
Implantable Lead Location: 753859
Implantable Lead Location: 753860
Implantable Pulse Generator Implant Date: 20201009
Lead Channel Impedance Value: 410 Ohm
Lead Channel Impedance Value: 540 Ohm
Lead Channel Pacing Threshold Amplitude: 0.5 V
Lead Channel Pacing Threshold Amplitude: 0.625 V
Lead Channel Pacing Threshold Pulse Width: 0.5 ms
Lead Channel Pacing Threshold Pulse Width: 0.5 ms
Lead Channel Sensing Intrinsic Amplitude: 1.4 mV
Lead Channel Sensing Intrinsic Amplitude: 7.3 mV
Lead Channel Setting Pacing Amplitude: 0.75 V
Lead Channel Setting Pacing Amplitude: 2 V
Lead Channel Setting Pacing Pulse Width: 0.5 ms
Lead Channel Setting Sensing Sensitivity: 4 mV
Pulse Gen Model: 2272
Pulse Gen Serial Number: 9166894

## 2020-08-04 ENCOUNTER — Telehealth: Payer: Self-pay

## 2020-08-04 NOTE — Telephone Encounter (Signed)
-----   Message from Julio Sicks, RN sent at 08/01/2020  5:19 PM EDT ----- Attempted to contact pt again and still receiving same message.  I did mail a letter this morning asking pt to contact the office to obtain results.  Will route to triage to see if they are able to reach pt next week in my absence.

## 2020-08-04 NOTE — Telephone Encounter (Signed)
Attempted to call patient with recent results. Phone line does not ring, states "call can not be completed at this time". Unable to leave a message for the patient.

## 2020-08-06 NOTE — Progress Notes (Signed)
Remote pacemaker transmission.   

## 2020-09-05 ENCOUNTER — Telehealth: Payer: Self-pay | Admitting: Interventional Cardiology

## 2020-09-05 NOTE — Telephone Encounter (Signed)
Attempted to call patient but the line is busy and unable to leave message. It appears he may be calling in for his echo results that a letter was mailed out about.

## 2020-09-05 NOTE — Telephone Encounter (Signed)
Patient calling back for results

## 2020-09-08 NOTE — Telephone Encounter (Signed)
Attempted to contact pt.  Message came on stating to enter my remote access code.  Unable to leave message.

## 2020-09-09 ENCOUNTER — Other Ambulatory Visit: Payer: Self-pay | Admitting: Interventional Cardiology

## 2020-09-09 NOTE — Telephone Encounter (Signed)
Spoke with someone that answered the phone and they said pt was out getting his vaccine.  Advised to have pt call back at his convenience.

## 2020-09-19 NOTE — Telephone Encounter (Signed)
Attempted to contact pt x 2.  Phone line busy.  

## 2020-09-23 ENCOUNTER — Other Ambulatory Visit: Payer: Self-pay | Admitting: *Deleted

## 2020-09-23 MED ORDER — CARVEDILOL 6.25 MG PO TABS: 6.2500 mg | ORAL_TABLET | Freq: Two times a day (BID) | ORAL | 0 refills | Status: DC

## 2020-09-23 NOTE — Telephone Encounter (Signed)
Spoke with pt and reviewed results and recommendations from [name]. Pt agreeable to EP appt. Advised I will send message to EP scheduler to get him in with them. Pt appreciative for call.

## 2020-10-03 ENCOUNTER — Encounter: Payer: Self-pay | Admitting: Physician Assistant

## 2020-10-03 ENCOUNTER — Ambulatory Visit: Payer: Medicare HMO | Admitting: Physician Assistant

## 2020-10-03 ENCOUNTER — Encounter (INDEPENDENT_AMBULATORY_CARE_PROVIDER_SITE_OTHER): Payer: Self-pay

## 2020-10-03 ENCOUNTER — Other Ambulatory Visit: Payer: Self-pay

## 2020-10-03 VITALS — BP 128/80 | HR 62 | Ht 70.0 in | Wt 180.8 lb

## 2020-10-03 DIAGNOSIS — I442 Atrioventricular block, complete: Secondary | ICD-10-CM

## 2020-10-03 DIAGNOSIS — I5022 Chronic systolic (congestive) heart failure: Secondary | ICD-10-CM | POA: Diagnosis not present

## 2020-10-03 DIAGNOSIS — I428 Other cardiomyopathies: Secondary | ICD-10-CM | POA: Diagnosis not present

## 2020-10-03 DIAGNOSIS — Z950 Presence of cardiac pacemaker: Secondary | ICD-10-CM | POA: Diagnosis not present

## 2020-10-03 DIAGNOSIS — I10 Essential (primary) hypertension: Secondary | ICD-10-CM

## 2020-10-03 MED ORDER — CARVEDILOL 6.25 MG PO TABS: 6.2500 mg | ORAL_TABLET | Freq: Two times a day (BID) | ORAL | 2 refills | Status: DC

## 2020-10-03 NOTE — Progress Notes (Signed)
Cardiology Office Note Date:  10/03/2020  Patient ID:  Steven Krueger., DOB 1935-02-12, MRN [id number] PCP:  Richmond Campbell., PA-C  Cardiologist:  [name] Electrophysiologist: [name]     Chief Complaint: follow up on echo  History of Present Illness: Steven Krueger. is a 85 y.o. male with history of gout, hypothyroidism, CHB w/PPM, HTN, HLD, PVCs, stroke, CKD (III), chronic CHF (systolic).  He comes in today to be seen for [name], last seen by him Jan 2021, at that time doing well, no symptoms reported, no changes made/  More recently saw [name] in Sept 2021, concerns for RV pacing induced CM.  Planned to update his echo, and mentioned EP for possible CRT if EF remained reduced/worse. LVEF noted 35-40%, global hypokinesis, mod conc LVH, RV OK He was referred to EP to consider CRT upgrade  TODAY He is doing quite well.   He denies any kind of exertional limitations outside of his knee.  He says as he has gotten older Mentions he is no longer a young man, but still gets up and stays busy, still rides his lawn mower, takes care of the lawn, denies any difficulties with his ADLs. He does not think that he feels any clear difference in his functional status in the No SOB, DOE, denies any symptoms of PND or orthopnea. No dizzy spells, near syncope or syncope.    Device information SJM dual chamber PPM, implanted 07/27/2019  Past Medical History:  Diagnosis Date  . Chronic kidney disease, stage 3 (HCC) 04/01/2014  . Complete heart block (HCC) 07/26/2019   temp wire placed  . CVA, old, hemiparesis (HCC) 04/01/2014  . Essential hypertension 04/01/2014  . Hyperlipidemia   . Hypothyroidism    takes Synthroid daily  . Kidney stones   . Osteoarthritis of right knee 04/01/2014  . Pacemaker 07/27/2019   St Jude dual chamber  . 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 Ventricular bigeminy 06/27/2014    Past Surgical History:  Procedure Laterality Date  . appendectomy    . COLONOSCOPY WITH PROPOFOL N/A 04/09/2016   Procedure: COLONOSCOPY WITH PROPOFOL;  Surgeon: [name], MD;  Location: [facility];  Service: Endoscopy;  Laterality: N/A;  . PACEMAKER IMPLANT N/A 07/27/2019   Procedure: PACEMAKER IMPLANT;  Surgeon: Marinus Maw, MD;  Location: [facility];  Service: Cardiovascular;  Laterality: N/A;  . TEMPORARY PACEMAKER N/A 07/26/2019   Procedure: TEMPORARY PACEMAKER;  Surgeon: Kathleene Hazel, MD;  Location: [facility];  Service: Cardiovascular;  Laterality: N/A;    Current Outpatient Medications  Medication Sig Dispense Refill  . allopurinol (ZYLOPRIM) 300 MG tablet TAKE ONE TABLET BY MOUTH EVERY DAY 30 tablet 5  . aspirin EC 81 MG tablet Take 1 tablet (81 mg total) by mouth daily. 90 tablet 3  . Cholecalciferol (VITAMIN D3) 10 MCG (400 UNIT) CHEW Chew 10 mcg by mouth daily.    Marland Kitchen levothyroxine (SYNTHROID, LEVOTHROID) 25 MCG tablet Take 25 mcg by mouth daily.    Marland Kitchen losartan-hydrochlorothiazide (HYZAAR) 50-12.5 MG tablet TAKE ONE TABLET BY MOUTH DAILY 90 tablet 3  . carvedilol (COREG) 6.25 MG tablet Take 1 tablet (6.25 mg total) by mouth 2 (two) times daily. 180 tablet 2   No current facility-administered medications for this visit.    Allergies:   Patient has  no known allergies.   Social History:  The patient  reports that he has quit smoking. His smokeless tobacco use includes chew. He reports that he does not drink alcohol and does not use drugs.   Family History:  The patient's family history includes CAD in his mother; Cancer in his father and another family member; Heart disease in an other family member; Hypertension in an other family member.  ROS:  Please see the history of present illness.    All other systems are reviewed and otherwise negative.   PHYSICAL EXAM:  VS:  BP 128/80    Pulse 62   Ht 5\' 10"  (1.778 m)   Wt 180 lb 12.8 oz (82 kg)   SpO2 96%   BMI 25.94 kg/m  BMI: Body mass index is 25.94 kg/m. Well nourished, well developed, in no acute distress HEENT: normocephalic, atraumatic Neck: no JVD, carotid bruits or masses Cardiac:  RRR; no significant murmurs, no rubs, or gallops Lungs:  CTA b/l, no wheezing, rhonchi or rales Abd: soft, nontender MS: no deformity, age appropriate atrophy Ext: trace edema Skin: warm and dry, no rash Neuro:  No gross deficits appreciated Psych: euthymic mood, full affect  PPM site is stable, no tethering or discomfort   EKG:  Done today and reviewed by myself shows  SR/VPaced 62bpm  Device interrogation done today and reviewed by myself:  Battery and lead measurements are good V paces today at 40bpm A cap confirm unable to confirm capture (is programmed to monitor only), output fixed to appropriate safety margin, my manual threshold today is 0.5V Known A lead far field, sensitivity not adjusted today 103 AMS episodes, no EGMs are available for review, one is 5 min long I have changed atrial trigger EGM collectin from [facility] to AMS so we can get a picture of these. (suspect are FF)   07/29/2020: TTE IMPRESSIONS  1. Significant dyssynchrony seen, even more prominent than last echo.  Global hypokinesis, most prominent at apex. LV thrombus excluded by  contrast. Left ventricular ejection fraction, by estimation, is 35 to 40%.  The left ventricle has moderately  decreased function. The left ventricle demonstrates global hypokinesis.  There is moderate concentric left ventricular hypertrophy. Left  ventricular diastolic parameters are indeterminate.  2. Right ventricular systolic function is normal. The right ventricular  size is normal. There is normal pulmonary artery systolic pressure.  3. Left atrial size was mildly dilated.  4. The mitral valve is grossly normal. Trivial mitral valve  regurgitation.  5. The  aortic valve is tricuspid. There is mild calcification of the  aortic valve. There is mild thickening of the aortic valve. Aortic valve  regurgitation is moderate. Mild aortic valve sclerosis is present, with no  evidence of aortic valve stenosis.    07/28/2019: TTE (pacer implanted 07/27/2019) IMPRESSIONS  1. Left ventricular ejection fraction, by visual estimation, is 35 to  40%. The left ventricle has moderately decreased function. Normal left  ventricular size. There is no left ventricular hypertrophy.  2. Definity contrast agent was given IV to delineate the left ventricular  endocardial borders.  3. Left ventricular diastolic function could not be evaluated pattern of  LV diastolic filling.  4. Global right ventricle has normal systolic function.The right  ventricular size is normal.  5. Left atrial size was normal.  6. Right atrial size was normal.  7. The mitral valve is normal in structure. Mild mitral valve  regurgitation. No evidence of mitral stenosis.  8. The tricuspid  valve is normal in structure. Tricuspid valve  regurgitation is trivial.  9. The aortic valve is normal in structure. Aortic valve regurgitation is  mild by color flow Doppler. Structurally normal aortic valve, with no  evidence of sclerosis or stenosis.  10. The pulmonic valve was normal in structure. Pulmonic valve  regurgitation is not visualized by color flow Doppler.  11. Apical akinesis with overall moderate LV dysfunction; no thrombus  noted using definity; mild AI and MR.    2018: LVEF 55-60%   Recent Labs: 07/02/2020: BUN 18; Creatinine, Ser 1.57; Potassium 3.9; Sodium 141  No results found for requested labs within last 8760 hours.   CrCl cannot be calculated (Patient's most recent lab result is older than the maximum 21 days allowed.).   Wt Readings from Last 3 Encounters:  10/03/20 180 lb 12.8 oz (82 kg)  07/02/20 178 lb 6.4 oz (80.9 kg)  10/30/19 181 lb (82.1 kg)     Other  studies reviewed: Additional studies/records reviewed today include: summarized above  ASSESSMENT AND PLAN:  1. PPM     Intact function, as noted above  2. NICM 3. Chronic CHF (systolic)     He has trace ankle edema, otherwise no symptoms or exam findings to suggest volume OL     On BB, ARB  tx  His LVEF has not changed with RV pacing/from the time of his PPM implant and do not appreciate any clinic decline or change in talking with the patient today Discussed with [name], he does have a very wide paced QRS He is device dependent.  Suggests that he be watch closely, if EF does start to change or her start to develop clnical change, issues with volume OL would revisit CRT upgrade He would like to see him in 33mo I have discussed symptoms of volume OL and to let us know should he start to develop any.  4. HTN     Looks good, no changes   Disposition: F/u with remotes as usual and [name] in 86mo, sooner if needed.  Current medicines are reviewed at length with the patient today.  The patient did not have any concerns regarding medicines.  Norma Fredrickson, PA-C 10/03/2020 6:27 PM     [facility] [address] [place] [address] [phone] (office)  [phone] (fax)

## 2020-10-03 NOTE — Patient Instructions (Addendum)
Medication Instructions:   Your physician recommends that you continue on your current medications as directed. Please refer to the Current Medication list given to you today.  *If you need a refill on your cardiac medications before your next appointment, please call your pharmacy*   Lab Work: NONE ORDERED  TODAY   If you have labs (blood work) drawn today and your tests are completely normal, you will receive your results only by: Marland Kitchen MyChart Message (if you have MyChart) OR . A paper copy in the mail If you have any lab test that is abnormal or we need to change your treatment, we will call you to review the results.   Testing/Procedures: NONE ORDERED  TODAY   Follow-Up: At [facility], you and your health needs are our priority.  As part of our continuing mission to provide you with exceptional heart care, we have created designated Provider Care Teams.  These Care Teams include your primary Cardiologist (physician) and Advanced Practice Providers (APPs -  Physician Assistants and Nurse Practitioners) who all work together to provide you with the care you need, when you need it.  We recommend signing up for the patient portal called "MyChart".  Sign up information is provided on this After Visit Summary.  MyChart is used to connect with patients for Virtual Visits (Telemedicine).  Patients are able to view lab/test results, encounter notes, upcoming appointments, etc.  Non-urgent messages can be sent to your provider as well.   To learn more about what you can do with MyChart, go to [email or web address].    Your next appointment:   6 month(s)  The format for your next appointment:   In Person  Provider:   You may see [name]     Other Instructions

## 2020-10-31 ENCOUNTER — Ambulatory Visit (INDEPENDENT_AMBULATORY_CARE_PROVIDER_SITE_OTHER): Payer: Medicare HMO

## 2020-10-31 DIAGNOSIS — I442 Atrioventricular block, complete: Secondary | ICD-10-CM

## 2020-11-03 LAB — CUP PACEART REMOTE DEVICE CHECK
Battery Remaining Longevity: 125 mo
Battery Remaining Percentage: 95.5 %
Battery Voltage: 3.05 V
Brady Statistic AP VP Percent: 20 %
Brady Statistic AP VS Percent: 1 %
Brady Statistic AS VP Percent: 77 %
Brady Statistic AS VS Percent: 1.6 %
Brady Statistic RA Percent Paced: 18 %
Brady Statistic RV Percent Paced: 96 %
Date Time Interrogation Session: 20220114142816
Implantable Lead Implant Date: 20201009
Implantable Lead Implant Date: 20201009
Implantable Lead Location: 753859
Implantable Lead Location: 753860
Implantable Pulse Generator Implant Date: 20201009
Lead Channel Impedance Value: 450 Ohm
Lead Channel Impedance Value: 550 Ohm
Lead Channel Pacing Threshold Amplitude: 0.5 V
Lead Channel Pacing Threshold Amplitude: 0.5 V
Lead Channel Pacing Threshold Pulse Width: 0.5 ms
Lead Channel Pacing Threshold Pulse Width: 0.5 ms
Lead Channel Sensing Intrinsic Amplitude: 1.2 mV
Lead Channel Sensing Intrinsic Amplitude: 12 mV
Lead Channel Setting Pacing Amplitude: 0.75 V
Lead Channel Setting Pacing Amplitude: 2 V
Lead Channel Setting Pacing Pulse Width: 0.5 ms
Lead Channel Setting Sensing Sensitivity: 4 mV
Pulse Gen Model: 2272
Pulse Gen Serial Number: 9166894

## 2020-11-14 NOTE — Progress Notes (Signed)
Remote pacemaker transmission.   

## 2020-11-30 IMAGING — DX DG CHEST 1V PORT
1 series · 1 of 1 positions shown · non-contrast
Comparison: Chest radiograph 04/17/2018

CLINICAL DATA: Patient with syncopal episode.

EXAM:
PORTABLE CHEST 1 VIEW

[chest ap]
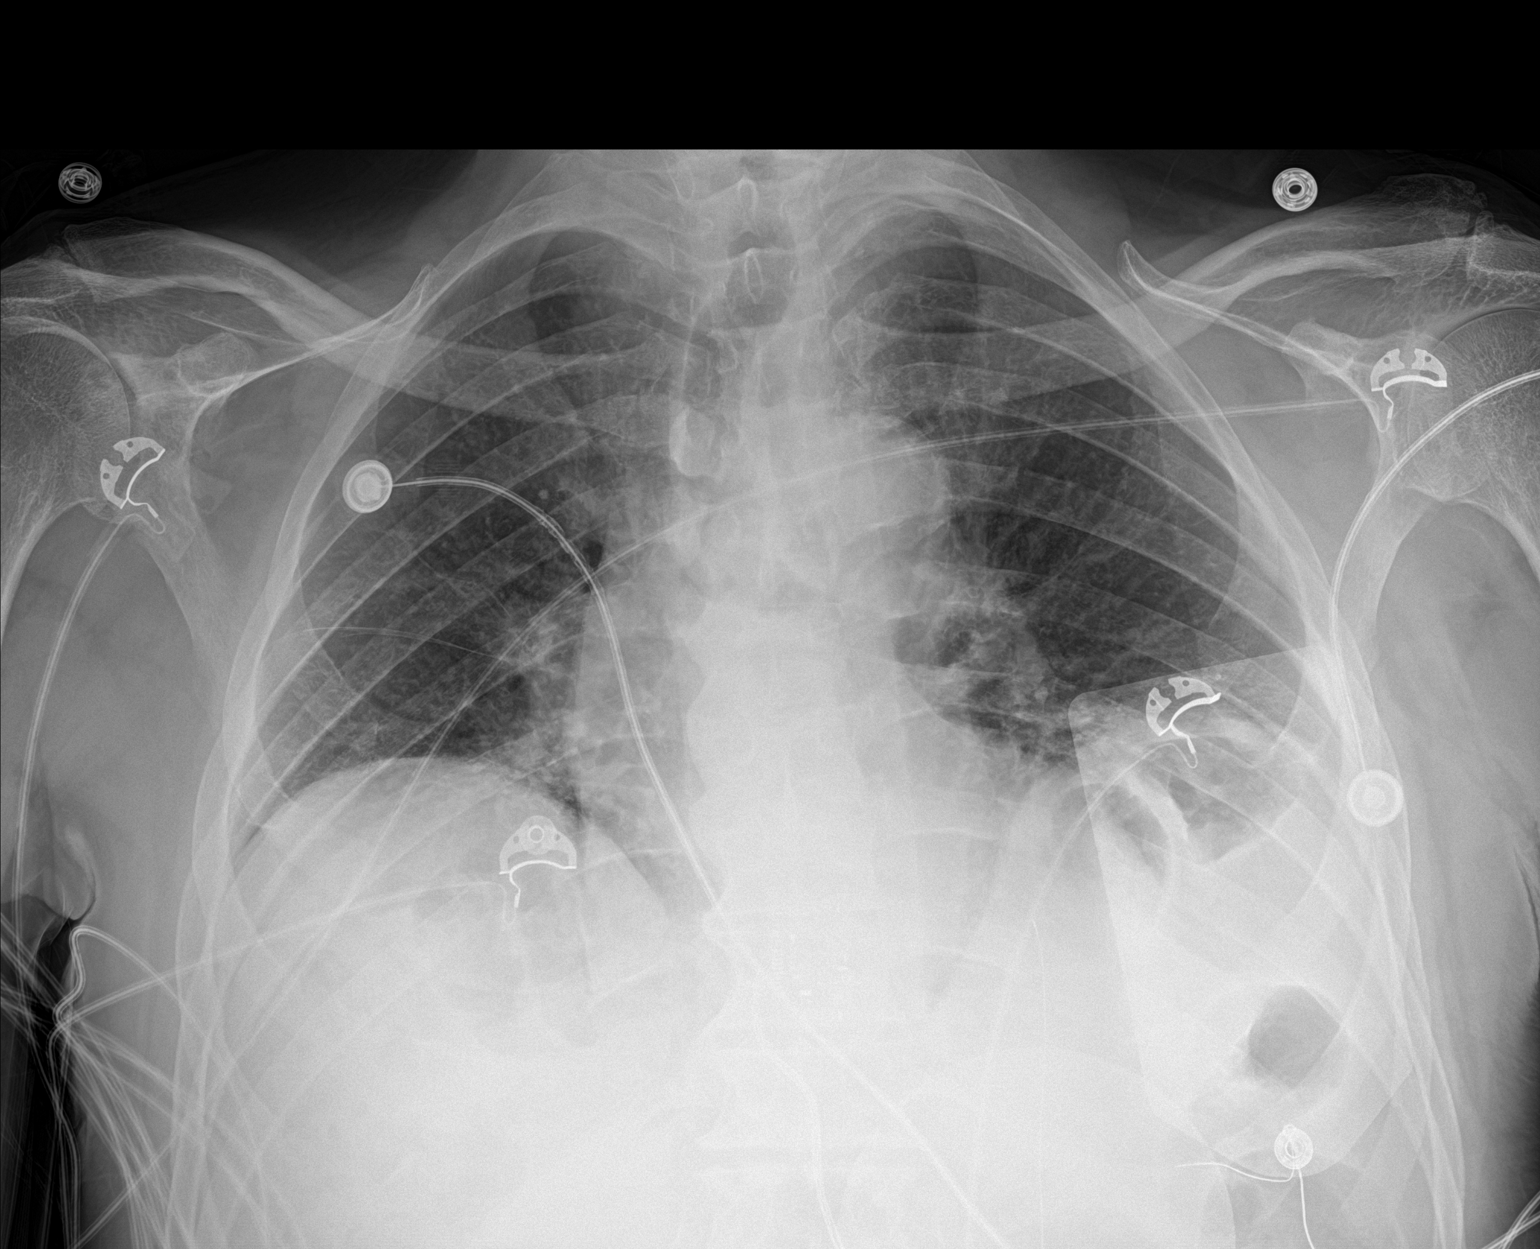

[1 of 1 positions shown; findings below may reference images not displayed]

FINDINGS: Monitoring leads overlie the patient. Pacer pad overlies the
patient. Stable cardiac and mediastinal contours. Low lung volumes.
Bibasilar atelectasis. No pleural effusion or pneumothorax. Thoracic
spine degenerative changes.
IMPRESSION: Low lung volumes with basilar atelectasis.

## 2020-12-27 NOTE — Progress Notes (Deleted)
Cardiology Office Note:    Date:  12/27/2020   ID:  Steven Bailey Sr., DOB 07/12/1935, MRN [id number]  PCP:  Steven Campbell., PA-C  Cardiologist:  Lesleigh Noe, MD   Referring MD: Steven Campbell., PA-C   No chief complaint on file.   History of Present Illness:    Steven Vanderhoef. is a 86 y.o. male with a hx of  trifascicular block, frequent PVCs, hypertension, prior stroke, stage III chronic kidney disease, chronic systolic heart failure with EF 35 to 40% and high grade AV block which was treated with PPM therapy 2020 after syncope following several years of follow-up.   ***  Past Medical History:  Diagnosis Date  . Chronic kidney disease, stage 3 (HCC) 04/01/2014  . Complete heart block (HCC) 07/26/2019   temp wire placed  . CVA, old, hemiparesis (HCC) 04/01/2014  . Essential hypertension 04/01/2014  . Hyperlipidemia   . Hypothyroidism    takes Synthroid daily  . Kidney stones   . Osteoarthritis of right knee 04/01/2014  . Pacemaker 07/27/2019   St Jude dual chamber  . 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 Ventricular bigeminy 06/27/2014    Past Surgical History:  Procedure Laterality Date  . appendectomy    . COLONOSCOPY WITH PROPOFOL N/A 04/09/2016   Procedure: COLONOSCOPY WITH PROPOFOL;  Surgeon: [name], MD;  Location: [facility];  Service: Endoscopy;  Laterality: N/A;  . PACEMAKER IMPLANT N/A 07/27/2019   Procedure: PACEMAKER IMPLANT;  Surgeon: Marinus Maw, MD;  Location: [facility];  Service: Cardiovascular;  Laterality: N/A;  . TEMPORARY PACEMAKER N/A 07/26/2019   Procedure: TEMPORARY PACEMAKER;  Surgeon: Kathleene Hazel, MD;  Location: [facility];  Service: Cardiovascular;  Laterality: N/A;    Current Medications: No outpatient medications have been marked as taking for the 12/29/20  encounter (Appointment) with Lyn Records, MD.     Allergies:   Patient has no known allergies.   Social History   Socioeconomic History  . Marital status: Married    Spouse name: Not on file  . Number of children: 2  . Years of education: Not on file  . Highest education level: Not on file  Occupational History  . Occupation: retired  Tobacco Use  . Smoking status: Former Games developer  . Smokeless tobacco: Current User    Types: Chew  Vaping Use  . Vaping Use: Never used  Substance and Sexual Activity  . Alcohol use: No  . Drug use: No  . Sexual activity: Not on file  Other Topics Concern  . Not on file  Social History Narrative  . Not on file   Social Determinants of Health   Financial Resource Strain: Not on file  Food Insecurity: Not on file  Transportation Needs: Not on file  Physical Activity: Not on file  Stress: Not on file  Social Connections: Not on file     Family History: The patient's family history includes CAD in his mother; Cancer in his father and another family member; Heart disease in an other family member; Hypertension in an other family member.  ROS:   Please see the history of present illness.    *** All other systems reviewed and are negative.  EKGs/Labs/Other Studies Reviewed:    The following studies were reviewed today: ***  EKG:  EKG ***  Recent Labs: 07/02/2020: BUN 18; Creatinine, Ser 1.57; Potassium 3.9; Sodium 141  Recent Lipid Panel    Component Value Date/Time   CHOL  10/30/2009 0604    143        ATP III CLASSIFICATION:  <200     mg/dL   Desirable  335-456  mg/dL   Borderline High  >=256    mg/dL   High          TRIG 73 10/30/2009 0604   HDL 24 (L) 10/30/2009 0604   CHOLHDL 6.0 10/30/2009 0604   VLDL 15 10/30/2009 0604   LDLCALC (H) 10/30/2009 0604    104        Total Cholesterol/HDL:CHD Risk Coronary Heart Disease Risk Table                     Men   Women  1/2 Average Risk   3.4   3.3  Average Risk       5.0    4.4  2 X Average Risk   9.6   7.1  3 X Average Risk  23.4   11.0        Use the calculated Patient Ratio above and the CHD Risk Table to determine the patient's CHD Risk.        ATP III CLASSIFICATION (LDL):  <100     mg/dL   Optimal  389-373  mg/dL   Near or Above                    Optimal  130-159  mg/dL   Borderline  428-768  mg/dL   High  >115     mg/dL   Very High    Physical Exam:    VS:  There were no vitals taken for this visit.    Wt Readings from Last 3 Encounters:  10/03/20 180 lb 12.8 oz (82 kg)  07/02/20 178 lb 6.4 oz (80.9 kg)  10/30/19 181 lb (82.1 kg)     GEN: ***. No acute distress HEENT: Normal NECK: No JVD. LYMPHATICS: No lymphadenopathy CARDIAC: *** murmur. RRR *** gallop, or edema. VASCULAR: *** Normal Pulses. No bruits. RESPIRATORY:  Clear to auscultation without rales, wheezing or rhonchi  ABDOMEN: Soft, non-tender, non-distended, No pulsatile mass, MUSCULOSKELETAL: No deformity  SKIN: Warm and dry NEUROLOGIC:  Alert and oriented x 3 PSYCHIATRIC:  Normal affect   ASSESSMENT:    1. Chronic systolic congestive heart failure (HCC)   2. Cardiac pacemaker in situ   3. Primary hypertension   4. Complete heart block (HCC)   5. Stage 3b chronic kidney disease (HCC)    PLAN:    In order of problems listed above:  1. ***   Medication Adjustments/Labs and Tests Ordered: Current medicines are reviewed at length with the patient today.  Concerns regarding medicines are outlined above.  No orders of the defined types were placed in this encounter.  No orders of the defined types were placed in this encounter.   There are no Patient Instructions on file for this visit.   Signed, Lesleigh Noe, MD  12/27/2020 5:08 PM    [facility]

## 2020-12-29 ENCOUNTER — Ambulatory Visit: Payer: Medicare PPO | Admitting: Interventional Cardiology

## 2020-12-29 DIAGNOSIS — I5022 Chronic systolic (congestive) heart failure: Secondary | ICD-10-CM

## 2020-12-29 DIAGNOSIS — I442 Atrioventricular block, complete: Secondary | ICD-10-CM

## 2020-12-29 DIAGNOSIS — N1832 Chronic kidney disease, stage 3b: Secondary | ICD-10-CM

## 2020-12-29 DIAGNOSIS — Z950 Presence of cardiac pacemaker: Secondary | ICD-10-CM

## 2020-12-29 DIAGNOSIS — I10 Essential (primary) hypertension: Secondary | ICD-10-CM

## 2021-01-30 ENCOUNTER — Ambulatory Visit (INDEPENDENT_AMBULATORY_CARE_PROVIDER_SITE_OTHER): Payer: Medicare PPO

## 2021-01-30 DIAGNOSIS — I442 Atrioventricular block, complete: Secondary | ICD-10-CM

## 2021-02-03 LAB — CUP PACEART REMOTE DEVICE CHECK
Battery Remaining Longevity: 124 mo
Battery Remaining Percentage: 95.5 %
Battery Voltage: 3.04 V
Brady Statistic AP VP Percent: 22 %
Brady Statistic AP VS Percent: 1 %
Brady Statistic AS VP Percent: 74 %
Brady Statistic AS VS Percent: 1.6 %
Brady Statistic RA Percent Paced: 21 %
Brady Statistic RV Percent Paced: 96 %
Date Time Interrogation Session: 20220415030015
Implantable Lead Implant Date: 20201009
Implantable Lead Implant Date: 20201009
Implantable Lead Location: 753859
Implantable Lead Location: 753860
Implantable Pulse Generator Implant Date: 20201009
Lead Channel Impedance Value: 410 Ohm
Lead Channel Impedance Value: 580 Ohm
Lead Channel Pacing Threshold Amplitude: 0.625 V
Lead Channel Pacing Threshold Amplitude: 0.625 V
Lead Channel Pacing Threshold Pulse Width: 0.5 ms
Lead Channel Pacing Threshold Pulse Width: 0.5 ms
Lead Channel Sensing Intrinsic Amplitude: 1.1 mV
Lead Channel Sensing Intrinsic Amplitude: 12 mV
Lead Channel Setting Pacing Amplitude: 0.875
Lead Channel Setting Pacing Amplitude: 2 V
Lead Channel Setting Pacing Pulse Width: 0.5 ms
Lead Channel Setting Sensing Sensitivity: 4 mV
Pulse Gen Model: 2272
Pulse Gen Serial Number: 9166894

## 2021-02-17 NOTE — Progress Notes (Signed)
Remote pacemaker transmission.   

## 2021-05-01 ENCOUNTER — Ambulatory Visit (INDEPENDENT_AMBULATORY_CARE_PROVIDER_SITE_OTHER): Payer: Medicare PPO

## 2021-05-01 DIAGNOSIS — I442 Atrioventricular block, complete: Secondary | ICD-10-CM | POA: Diagnosis not present

## 2021-05-04 LAB — CUP PACEART REMOTE DEVICE CHECK
Battery Remaining Longevity: 104 mo
Battery Remaining Percentage: 85 %
Battery Voltage: 3.04 V
Brady Statistic AP VP Percent: 23 %
Brady Statistic AP VS Percent: 1 %
Brady Statistic AS VP Percent: 73 %
Brady Statistic AS VS Percent: 1.7 %
Brady Statistic RA Percent Paced: 22 %
Brady Statistic RV Percent Paced: 96 %
Date Time Interrogation Session: 20220715034727
Implantable Lead Implant Date: 20201009
Implantable Lead Implant Date: 20201009
Implantable Lead Location: 753859
Implantable Lead Location: 753860
Implantable Pulse Generator Implant Date: 20201009
Lead Channel Impedance Value: 410 Ohm
Lead Channel Impedance Value: 600 Ohm
Lead Channel Pacing Threshold Amplitude: 0.5 V
Lead Channel Pacing Threshold Amplitude: 0.5 V
Lead Channel Pacing Threshold Pulse Width: 0.5 ms
Lead Channel Pacing Threshold Pulse Width: 0.5 ms
Lead Channel Sensing Intrinsic Amplitude: 1 mV
Lead Channel Sensing Intrinsic Amplitude: 11.5 mV
Lead Channel Setting Pacing Amplitude: 0.75 V
Lead Channel Setting Pacing Amplitude: 2 V
Lead Channel Setting Pacing Pulse Width: 0.5 ms
Lead Channel Setting Sensing Sensitivity: 4 mV
Pulse Gen Model: 2272
Pulse Gen Serial Number: 9166894

## 2021-05-25 NOTE — Progress Notes (Signed)
Remote pacemaker transmission.   

## 2021-05-26 ENCOUNTER — Other Ambulatory Visit: Payer: Self-pay | Admitting: Physician Assistant

## 2021-07-23 ENCOUNTER — Encounter (HOSPITAL_COMMUNITY): Payer: Self-pay | Admitting: *Deleted

## 2021-07-23 ENCOUNTER — Emergency Department (HOSPITAL_COMMUNITY): Payer: Medicare PPO

## 2021-07-23 ENCOUNTER — Emergency Department (HOSPITAL_COMMUNITY)
Admission: EM | Admit: 2021-07-23 | Discharge: 2021-07-23 | Disposition: A | Discharge status: Home / Self Care | Payer: Medicare PPO | Attending: Emergency Medicine | Admitting: Emergency Medicine

## 2021-07-23 ENCOUNTER — Other Ambulatory Visit: Payer: Self-pay

## 2021-07-23 DIAGNOSIS — R531 Weakness: Secondary | ICD-10-CM | POA: Diagnosis not present

## 2021-07-23 DIAGNOSIS — Z950 Presence of cardiac pacemaker: Secondary | ICD-10-CM | POA: Insufficient documentation

## 2021-07-23 DIAGNOSIS — E039 Hypothyroidism, unspecified: Secondary | ICD-10-CM | POA: Diagnosis not present

## 2021-07-23 DIAGNOSIS — Z7982 Long term (current) use of aspirin: Secondary | ICD-10-CM | POA: Diagnosis not present

## 2021-07-23 DIAGNOSIS — E860 Dehydration: Secondary | ICD-10-CM | POA: Insufficient documentation

## 2021-07-23 DIAGNOSIS — N183 Chronic kidney disease, stage 3 unspecified: Secondary | ICD-10-CM | POA: Diagnosis not present

## 2021-07-23 DIAGNOSIS — I129 Hypertensive chronic kidney disease with stage 1 through stage 4 chronic kidney disease, or unspecified chronic kidney disease: Secondary | ICD-10-CM | POA: Diagnosis not present

## 2021-07-23 DIAGNOSIS — Z79899 Other long term (current) drug therapy: Secondary | ICD-10-CM | POA: Insufficient documentation

## 2021-07-23 DIAGNOSIS — Z87891 Personal history of nicotine dependence: Secondary | ICD-10-CM | POA: Insufficient documentation

## 2021-07-23 LAB — CBC
HCT: 37.9 % — ABNORMAL LOW (ref 39.0–52.0)
Hemoglobin: 12.4 g/dL — ABNORMAL LOW (ref 13.0–17.0)
MCH: 30.2 pg (ref 26.0–34.0)
MCHC: 32.7 g/dL (ref 30.0–36.0)
MCV: 92.2 fL (ref 80.0–100.0)
Platelets: 185 10*3/uL (ref 150–400)
RBC: 4.11 MIL/uL — ABNORMAL LOW (ref 4.22–5.81)
RDW: 14.9 % (ref 11.5–15.5)
WBC: 8.9 10*3/uL (ref 4.0–10.5)
nRBC: 0 % (ref 0.0–0.2)

## 2021-07-23 LAB — BASIC METABOLIC PANEL
Anion gap: 10 (ref 5–15)
BUN: 40 mg/dL — ABNORMAL HIGH (ref 8–23)
CO2: 23 mmol/L (ref 22–32)
Calcium: 8.4 mg/dL — ABNORMAL LOW (ref 8.9–10.3)
Chloride: 100 mmol/L (ref 98–111)
Creatinine, Ser: 2.5 mg/dL — ABNORMAL HIGH (ref 0.61–1.24)
GFR, Estimated: 25 mL/min — ABNORMAL LOW (ref >60)
Glucose, Bld: 101 mg/dL — ABNORMAL HIGH (ref 70–99)
Potassium: 4 mmol/L (ref 3.5–5.1)
Sodium: 133 mmol/L — ABNORMAL LOW (ref 135–145)

## 2021-07-23 LAB — URINALYSIS, ROUTINE W REFLEX MICROSCOPIC
Bacteria, UA: NONE SEEN
Bilirubin Urine: NEGATIVE
Glucose, UA: NEGATIVE mg/dL
Ketones, ur: NEGATIVE mg/dL
Leukocytes,Ua: NEGATIVE
Nitrite: NEGATIVE
Protein, ur: NEGATIVE mg/dL
Specific Gravity, Urine: 1.011 (ref 1.005–1.030)
pH: 6 (ref 5.0–8.0)

## 2021-07-23 MED ORDER — SODIUM CHLORIDE 0.9 % IV BOLUS: 1000.0000 mL | Freq: Once | INTRAVENOUS | Status: DC

## 2021-07-23 MED ORDER — SODIUM CHLORIDE 0.9 % IV BOLUS
1000.0000 mL | Freq: Once | INTRAVENOUS | Status: AC
  Administered 2021-07-23: 1000 mL via INTRAVENOUS

## 2021-07-23 NOTE — ED Triage Notes (Signed)
Pt c/o bilateral leg weakness that started Tuesday morning when he woke up.

## 2021-07-23 NOTE — ED Provider Notes (Signed)
Care transferred to me.  Patient's urinalysis is pretty unremarkable.  He does have worsening kidney function than last year but is hard to know when exactly it occurred.  However he and the son indicate that he is been under a lot of stress recently with a family member passing away and his wife having dementia.  He is not eating quite as well.  He is feeling a lot better and able to ambulate and feels less weak after the IV fluids given by Dr. Jeraldine Loots.  Vitals are unremarkable.  He feels well enough for home which I think is pretty reasonable and discussed that he needs to get a repeat kidney function check next week.  Call his doctor to see if any meds need to be adjusted.  Return if any symptoms worsen. No abdominal pain.    Pricilla Loveless, MD 07/23/21 [phone]

## 2021-07-23 NOTE — ED Notes (Signed)
Pt wheeled to waiting room and placed in car with son. Pt verbalized understanding of discharge instructions.

## 2021-07-23 NOTE — Discharge Instructions (Signed)
Your kidney numbers are worse than the last time they were checked in this computer system about a year ago.  This is probably due to dehydration.  Make sure you are eating and drinking properly.  Call your primary care physician tomorrow to see if any medications need to be adjusted and make sure you follow-up with your primary care physician or kidney doctor next week for recheck of your blood work.  If any symptoms worsen or new symptoms arise then call 911 or return to the ER.

## 2021-07-23 NOTE — ED Notes (Signed)
PT ambulatory with walker with steady gate. Pt denies dizziness/lightheadedness.

## 2021-07-23 NOTE — ED Provider Notes (Signed)
[facility] EMERGENCY DEPARTMENT Provider Note   CSN: [id number] Arrival date & time: 07/23/21  0941     History Chief Complaint  Patient presents with   Extremity Weakness    Steven Krueger. is a 86 y.o. male.  HPI Patient presents with concern of bilateral weakness lower extremities for 2 or 3 days.  Patient also had a fall, possibly 1 week ago, though this is unclear.  Patient is here with his son who assists with history. Patient self currently denies any complaints, any focal weakness, any nausea.  He states that his weakness is noticeable when he is trying to walk, and he feels unsteady.  He has multiple medical issues including prior stroke, complete heart block with pacemaker placement, and osteoarthritis of the right knee sufficient is toward surgery, but he has not been deemed an appropriate surgical candidate. With weakness while ambulating for the past 2 days, and concern of a fall that occurred last week he presents for evaluation.    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   Right bundle, left anterior hemiblock, and first degree AV block, June, 2015    Ventricular bigeminy 06/27/2014    Patient Active Problem List   Diagnosis Date Noted   Pacemaker 10/30/2019   Complete heart block (HCC) 07/26/2019   Excessive thirst 12/19/2015   Hyperlipemia 12/19/2015   Knee pain 12/19/2015   Malaise and fatigue 12/19/2015   Multiple and bilateral precerebral artery stenosis 12/19/2015   Numbness and tingling of foot 12/19/2015   Old  myocardial infarction 12/19/2015   Sensation of cold in lower extremity 12/19/2015   Shoulder injury 12/19/2015   Sinus bradycardia 12/19/2015   Osteoarthritis of knee 12/19/2015   Allergic rhinitis 11/14/2015   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   Bradycardia 06/27/2014   Bigeminy 06/27/2014   Ventricular bigeminy 06/27/2014   Essential hypertension 04/01/2014   Hypothyroidism 04/01/2014   CVA, old, hemiparesis (HCC) 04/01/2014   Stage 3 chronic kidney disease (HCC) 04/01/2014   Osteoarthritis of right knee 04/01/2014   Hemiplegia (HCC) 04/01/2014    Past Surgical History:  Procedure Laterality Date   appendectomy     COLONOSCOPY WITH PROPOFOL N/A 04/09/2016   Procedure: COLONOSCOPY WITH PROPOFOL;  Surgeon: [name], MD;  Location: [facility];  Service: Endoscopy;  Laterality: N/A;   PACEMAKER IMPLANT N/A 07/27/2019   Procedure: PACEMAKER IMPLANT;  Surgeon: Marinus Maw, MD;  Location: [facility] INVASIVE CV LAB;  Service: Cardiovascular;  Laterality: N/A;   TEMPORARY PACEMAKER N/A 07/26/2019   Procedure: TEMPORARY PACEMAKER;  Surgeon: Kathleene Hazel, MD;  Location: [facility] INVASIVE CV LAB;  Service: Cardiovascular;  Laterality: N/A;       Family History  Problem Relation Age of Onset   CAD Mother    Cancer Father    Heart disease Other    Hypertension Other    Cancer Other     Social History   Tobacco Use   Smoking status: Former   Smokeless tobacco: Current    Types: Chew  Vaping  Use   Vaping Use: Never used  Substance Use Topics   Alcohol use: No   Drug use: No    Home Medications Prior to Admission medications   Medication Sig Start Date End Date Taking? Authorizing Provider  allopurinol (ZYLOPRIM) 300 MG tablet TAKE ONE TABLET BY MOUTH EVERY DAY 06/02/17   Darreld Mclean, MD  aspirin EC 81 MG tablet Take 1 tablet (81 mg total) by mouth daily. 04/19/17   Camnitz, Andree Coss, MD  carvedilol (COREG) 6.25 MG tablet Take 1 tablet (6.25 mg total) by mouth 2 (two)  times daily. 10/03/20   Sheilah Pigeon, PA-C  Cholecalciferol (VITAMIN D3) 10 MCG (400 UNIT) CHEW Chew 10 mcg by mouth daily.    [provider]  levothyroxine (SYNTHROID, LEVOTHROID) 25 MCG tablet Take 25 mcg by mouth daily.    [provider]  losartan-hydrochlorothiazide Mauri Reading) 50-12.5 MG tablet TAKE ONE TABLET BY MOUTH DAILY 07/10/20   Lyn Records, MD    Allergies    Patient has no known allergies.  Review of Systems   Review of Systems  Constitutional:        Per HPI, otherwise negative  HENT:         Per HPI, otherwise negative  Respiratory:         Per HPI, otherwise negative  Cardiovascular:        Per HPI, otherwise negative  Gastrointestinal:  Negative for vomiting.  Endocrine:       Negative aside from HPI  Genitourinary:        Neg aside from HPI   Musculoskeletal:        Per HPI, otherwise negative  Skin: Negative.   Neurological:  Positive for weakness. Negative for syncope.   Physical Exam Updated Vital Signs BP 104/60 (BP Location: Right Arm)   Pulse 74   Temp 98.5 F (36.9 C) (Oral)   Resp 18   Ht 5\' 9"  (1.753 m)   Wt 81.6 kg   SpO2 95%   BMI 26.58 kg/m   Physical Exam Vitals and nursing note reviewed.  Constitutional:      General: He is not in acute distress.    Appearance: He is well-developed. He is not ill-appearing, toxic-appearing or diaphoretic.  HENT:     Head: Normocephalic and atraumatic.  Eyes:     Conjunctiva/sclera: Conjunctivae normal.  Cardiovascular:     Rate and Rhythm: Normal rate and regular rhythm.  Pulmonary:     Effort: Pulmonary effort is normal. No respiratory distress.     Breath sounds: No stridor.  Abdominal:     General: There is no distension.  Musculoskeletal:        General: No tenderness or deformity.     Right lower leg: No edema.     Left lower leg: No edema.  Skin:    General: Skin is warm and dry.  Neurological:     Mental Status: He is alert and oriented to person, place,  and time.     Comments: Age-appropriate atrophy, otherwise neurologic exam unremarkable, symmetric strength bilateral lower extremities, 5/5 proximal, distal.  Face is symmetric, speech is clear, patient is oriented appropriately.  Psychiatric:        Mood and Affect: Mood normal.    ED Results / Procedures / Treatments   Labs (all labs ordered are listed, but only abnormal results are displayed) Labs Reviewed  BASIC METABOLIC PANEL - Abnormal; Notable for the following components:  Result Value   Sodium 133 (*)    Glucose, Bld 101 (*)    BUN 40 (*)    Creatinine, Ser 2.50 (*)    Calcium 8.4 (*)    GFR, Estimated 25 (*)    All other components within normal limits  CBC - Abnormal; Notable for the following components:   RBC 4.11 (*)    Hemoglobin 12.4 (*)    HCT 37.9 (*)    All other components within normal limits  URINALYSIS, ROUTINE W REFLEX MICROSCOPIC  CBG MONITORING, ED    EKG None  Radiology CT Head Wo Contrast  Result Date: 07/23/2021 CLINICAL DATA:  Fall last week.  Lower extremity weakness. EXAM: CT HEAD WITHOUT CONTRAST TECHNIQUE: Contiguous axial images were obtained from the base of the skull through the vertex without intravenous contrast. COMPARISON:  October 29, 2009. FINDINGS: Brain: Mild chronic ischemic white matter disease is noted. No mass effect or midline shift is noted. Ventricular size is within normal limits. There is no evidence of mass lesion, hemorrhage or acute infarction. Vascular: No hyperdense vessel or unexpected calcification. Skull: Normal. Negative for fracture or focal lesion. Sinuses/Orbits: No acute finding. Other: None. IMPRESSION: No acute intracranial abnormality seen. Electronically Signed   By: Lupita Raider M.D.   On: 07/23/2021 12:36    Procedures Procedures   Medications Ordered in ED Medications  sodium chloride 0.9 % bolus 1,000 mL (1,000 mLs Intravenous New Bag/Given 07/23/21 1223)    ED Course  I have reviewed the  triage vital signs and the nursing notes.  Pertinent labs & imaging results that were available during my care of the patient were reviewed by me and considered in my medical decision making (see chart for details).  Update: Initial labs notable for creatinine elevated beyond 2, BUN 40.  3:40 PM On repeat exam patient awake, alert, in no distress, notes that he feels somewhat better.  With encouragement patient has just been able to produce a urine specimen.  He has received 1 L, and is receiving a second of normal saline.  Labs reviewed, discussed, similarly head CT reviewed, discussed, no intracranial abnormalities, labs generally reassuring aside from evidence for dehydration.  With consideration of this plus minus urinary tract infection, the patient is receiving additional resuscitation as above, will require repeat evaluation.   Though the chief complaint was lower extremity weakness, patient's weakness is more general with ambulation and activity consistent with early findings of dehydration.  No back pain, no red flags suggesting cauda equina and head CT is unremarkable.  Dr. Criss Alvine is aware  Final Clinical Impression(s) / ED Diagnoses Final diagnoses:  Weakness  Dehydration     Gerhard Munch, MD 07/23/21 1542

## 2021-07-23 NOTE — ED Provider Notes (Signed)
Emergency Medicine Provider Triage Evaluation Note  Steven KRONE Sr. , a 86 y.o. male with h/o stroke in the past, pacemaker was evaluated in triage.  Pt complains of bilateral leg weakness for the past two days. The son reports that the patient fell in the bathtub a week ago. The patient reports that he can't remember how or why he fell, but he fell backwards.  Review of Systems  Positive: Bilateral leg weakness Negative: Numbness, tingling, saddle anesthesia, urinary retention, fecal/bladder incontinence  Physical Exam  Ht 5\' 9"  (1.753 m)   Wt 81.6 kg   BMI 26.58 kg/m  Gen:   Awake, no distress   Resp:  Normal effort  MSK:   Moves extremities without difficulty  Other:  Equal strength in bilateral upper and lower extremities. Compartments soft.  Medical Decision Making  Medically screening exam initiated at 10:44 AM.  Appropriate orders placed.  Sr. was informed that the remainder of the evaluation will be completed by another provider, this initial triage assessment does not replace that evaluation, and the importance of remaining in the ED until their evaluation is complete.  Labs and imaging ordered.   Sudie Bailey, PA-C 07/23/21 1051    09/22/21, MD 07/23/21 1244

## 2021-07-31 ENCOUNTER — Ambulatory Visit (INDEPENDENT_AMBULATORY_CARE_PROVIDER_SITE_OTHER): Payer: Medicare PPO

## 2021-07-31 ENCOUNTER — Other Ambulatory Visit: Payer: Self-pay | Admitting: Nephrology

## 2021-07-31 DIAGNOSIS — N1832 Chronic kidney disease, stage 3b: Secondary | ICD-10-CM

## 2021-07-31 DIAGNOSIS — I442 Atrioventricular block, complete: Secondary | ICD-10-CM | POA: Diagnosis not present

## 2021-07-31 DIAGNOSIS — R339 Retention of urine, unspecified: Secondary | ICD-10-CM

## 2021-08-03 LAB — CUP PACEART REMOTE DEVICE CHECK
Battery Remaining Longevity: 100 mo
Battery Remaining Percentage: 83 %
Battery Voltage: 3.05 V
Brady Statistic AP VP Percent: 24 %
Brady Statistic AP VS Percent: 1 %
Brady Statistic AS VP Percent: 72 %
Brady Statistic AS VS Percent: 1.7 %
Brady Statistic RA Percent Paced: 23 %
Brady Statistic RV Percent Paced: 96 %
Date Time Interrogation Session: 20221014022937
Implantable Lead Implant Date: 20201009
Implantable Lead Implant Date: 20201009
Implantable Lead Location: 753859
Implantable Lead Location: 753860
Implantable Pulse Generator Implant Date: 20201009
Lead Channel Impedance Value: 400 Ohm
Lead Channel Impedance Value: 540 Ohm
Lead Channel Pacing Threshold Amplitude: 0.5 V
Lead Channel Pacing Threshold Amplitude: 0.625 V
Lead Channel Pacing Threshold Pulse Width: 0.5 ms
Lead Channel Pacing Threshold Pulse Width: 0.5 ms
Lead Channel Sensing Intrinsic Amplitude: 0.9 mV
Lead Channel Sensing Intrinsic Amplitude: 10.1 mV
Lead Channel Setting Pacing Amplitude: 0.875
Lead Channel Setting Pacing Amplitude: 2 V
Lead Channel Setting Pacing Pulse Width: 0.5 ms
Lead Channel Setting Sensing Sensitivity: 4 mV
Pulse Gen Model: 2272
Pulse Gen Serial Number: 9166894

## 2021-08-10 NOTE — Progress Notes (Signed)
Remote pacemaker transmission.   

## 2021-08-19 ENCOUNTER — Other Ambulatory Visit: Payer: Medicare PPO

## 2021-08-26 ENCOUNTER — Other Ambulatory Visit: Payer: Medicare PPO

## 2021-09-15 ENCOUNTER — Other Ambulatory Visit: Payer: Self-pay

## 2021-09-15 ENCOUNTER — Ambulatory Visit: Payer: Medicare PPO | Admitting: Internal Medicine

## 2021-09-15 ENCOUNTER — Encounter (INDEPENDENT_AMBULATORY_CARE_PROVIDER_SITE_OTHER): Payer: Self-pay

## 2021-09-15 VITALS — BP 128/78 | HR 77 | Ht 70.0 in | Wt 173.8 lb

## 2021-09-15 DIAGNOSIS — Z950 Presence of cardiac pacemaker: Secondary | ICD-10-CM

## 2021-09-15 DIAGNOSIS — I442 Atrioventricular block, complete: Secondary | ICD-10-CM

## 2021-09-15 NOTE — Patient Instructions (Signed)
Medication Instructions:  Your physician recommends that you continue on your current medications as directed. Please refer to the Current Medication list given to you today.  Labwork: None ordered.  Testing/Procedures: None ordered.  Follow-Up: Your physician wants you to follow-up in: one year with Lewayne Bunting, MD or one of the following Advanced Practice Providers on your designated Care Team:   Francis Dowse, [place] Casimiro Needle "Mardelle Matte" Lanna Poche, [place]  Remote monitoring is used to monitor your Pacemaker from home. This monitoring reduces the number of office visits required to check your device to one time per year. It allows [place] to keep an eye on the functioning of your device to ensure it is working properly. You are scheduled for a device check from home on 10/30/2021. You may send your transmission at any time that day. If you have a wireless device, the transmission will be sent automatically. After your physician reviews your transmission, you will receive a postcard with your next transmission date.  Any Other Special Instructions Will Be Listed Below (If Applicable).  If you need a refill on your cardiac medications before your next appointment, please call your pharmacy.

## 2021-09-15 NOTE — Progress Notes (Signed)
HPI Mr. Steven Krueger returns today for followup of CHB, s/p PPM insertion, HTN, PVC's, and remote stroke. He has been bothered by arthritis. His right knee is bone on bone. The patient denies chest pain or sob. He has mild to moderated LV dysfunction. He has a paced QRS with a right bundle morphology. He is not particularly active but has not c/o symptomatic heart failure symptoms.  No Known Allergies   Current Outpatient Medications  Medication Sig Dispense Refill   allopurinol (ZYLOPRIM) 300 MG tablet TAKE ONE TABLET BY MOUTH EVERY DAY (Patient taking differently: Take 300 mg by mouth daily.) 30 tablet 5   aspirin EC 81 MG tablet Take 1 tablet (81 mg total) by mouth daily. 90 tablet 3   carvedilol (COREG) 6.25 MG tablet Take 1 tablet (6.25 mg total) by mouth 2 (two) times daily. 180 tablet 2   Cholecalciferol (VITAMIN D3) 10 MCG (400 UNIT) CHEW Chew 10 mcg by mouth daily.     levothyroxine (SYNTHROID, LEVOTHROID) 25 MCG tablet Take 25 mcg by mouth daily.     losartan-hydrochlorothiazide (HYZAAR) 50-12.5 MG tablet TAKE ONE TABLET BY MOUTH DAILY (Patient taking differently: Take 1 tablet by mouth daily.) 90 tablet 3   No current facility-administered medications for this visit.     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   Past Surgical History:  Procedure  Laterality Date   appendectomy     COLONOSCOPY WITH PROPOFOL N/A 04/09/2016   Procedure: COLONOSCOPY WITH PROPOFOL;  Surgeon: Carol Ada, MD;  Location: [facility];  Service: Endoscopy;  Laterality: N/A;   PACEMAKER IMPLANT N/A 07/27/2019   Procedure: PACEMAKER IMPLANT;  Surgeon: Evans Lance, MD;  Location: [place] CV LAB;  Service: Cardiovascular;  Laterality: N/A;   TEMPORARY PACEMAKER N/A 07/26/2019   Procedure: TEMPORARY PACEMAKER;  Surgeon: Burnell Blanks, MD;  Location: [place] [facility] LAB;  Service: Cardiovascular;  Laterality: N/A;     Family History  Problem Relation Age of Onset   CAD Mother    Cancer Father    Heart disease Other    Hypertension Other    Cancer Other      Social History   Socioeconomic History   Marital status: Married    Spouse name: Not on file   Number of children: 2   Years of education: Not on file   Highest education level: Not on file  Occupational History   Occupation: retired  Tobacco Use   Smoking status: Former   Smokeless tobacco: Current    Types: Nurse, children's Use: Never used  Substance and Sexual Activity   Alcohol use: No   Drug use: No   Sexual activity: Not on file  Other Topics Concern   Not on file  Social History  Narrative   Not on file   Social Determinants of Health   Financial Resource Strain: Not on file  Food Insecurity: Not on file  Transportation Needs: Not on file  Physical Activity: Not on file  Stress: Not on file  Social Connections: Not on file  Intimate Partner Violence: Not on file     BP 128/78   Pulse 77   Ht 5\' 10"  (1.778 m)   Wt 173 lb 12.8 oz (78.8 kg)   SpO2 94%   BMI 24.94 kg/m   Physical Exam:  Well appearing NAD HEENT: Unremarkable Neck:  No JVD, no thyromegally Lymphatics:  No adenopathy Back:  No CVA tenderness Lungs:  Clear with no wheezes HEART:  Regular rate rhythm, no murmurs, no rubs, no clicks Abd:  soft, positive bowel sounds, no  organomegally, no rebound, no guarding Ext:  2 plus pulses, no edema, no cyanosis, no clubbing Skin:  No rashes no nodules Neuro:  CN II through XII intact, motor grossly intact  EKG - none  DEVICE  Normal device function.  See PaceArt for details.   Assess/Plan:  CHB - he is asymptomatic s/p PPM insertion.  PPM - his St. Jude DDD PM is working normally.  HTN -his bp is well controlled.  Chronic systolic heart failure - his symptoms are class 2A. No change in his meds.  Steven Cefalu,MD

## 2021-10-10 ENCOUNTER — Other Ambulatory Visit: Payer: Self-pay

## 2021-10-10 ENCOUNTER — Observation Stay (HOSPITAL_COMMUNITY)
Admission: EM | Admit: 2021-10-10 | Discharge: 2021-10-11 | Disposition: A | Discharge status: Home / Self Care | Payer: Medicare PPO | Attending: Family Medicine | Admitting: Family Medicine

## 2021-10-10 ENCOUNTER — Encounter (HOSPITAL_COMMUNITY): Payer: Self-pay | Admitting: Emergency Medicine

## 2021-10-10 ENCOUNTER — Emergency Department (HOSPITAL_COMMUNITY): Payer: Medicare PPO

## 2021-10-10 DIAGNOSIS — N183 Chronic kidney disease, stage 3 unspecified: Secondary | ICD-10-CM

## 2021-10-10 DIAGNOSIS — Z20822 Contact with and (suspected) exposure to covid-19: Secondary | ICD-10-CM | POA: Diagnosis not present

## 2021-10-10 DIAGNOSIS — I10 Essential (primary) hypertension: Secondary | ICD-10-CM

## 2021-10-10 DIAGNOSIS — Z87891 Personal history of nicotine dependence: Secondary | ICD-10-CM | POA: Diagnosis not present

## 2021-10-10 DIAGNOSIS — Z79899 Other long term (current) drug therapy: Secondary | ICD-10-CM | POA: Diagnosis not present

## 2021-10-10 DIAGNOSIS — I639 Cerebral infarction, unspecified: Secondary | ICD-10-CM | POA: Diagnosis not present

## 2021-10-10 DIAGNOSIS — R55 Syncope and collapse: Secondary | ICD-10-CM

## 2021-10-10 DIAGNOSIS — Z950 Presence of cardiac pacemaker: Secondary | ICD-10-CM | POA: Diagnosis not present

## 2021-10-10 DIAGNOSIS — Z7982 Long term (current) use of aspirin: Secondary | ICD-10-CM | POA: Diagnosis not present

## 2021-10-10 DIAGNOSIS — I428 Other cardiomyopathies: Secondary | ICD-10-CM

## 2021-10-10 DIAGNOSIS — E039 Hypothyroidism, unspecified: Secondary | ICD-10-CM | POA: Diagnosis not present

## 2021-10-10 DIAGNOSIS — R200 Anesthesia of skin: Secondary | ICD-10-CM | POA: Diagnosis present

## 2021-10-10 DIAGNOSIS — I5022 Chronic systolic (congestive) heart failure: Secondary | ICD-10-CM | POA: Diagnosis not present

## 2021-10-10 DIAGNOSIS — I442 Atrioventricular block, complete: Secondary | ICD-10-CM | POA: Diagnosis present

## 2021-10-10 DIAGNOSIS — N1832 Chronic kidney disease, stage 3b: Secondary | ICD-10-CM | POA: Diagnosis not present

## 2021-10-10 DIAGNOSIS — I129 Hypertensive chronic kidney disease with stage 1 through stage 4 chronic kidney disease, or unspecified chronic kidney disease: Secondary | ICD-10-CM | POA: Insufficient documentation

## 2021-10-10 DIAGNOSIS — Z8673 Personal history of transient ischemic attack (TIA), and cerebral infarction without residual deficits: Secondary | ICD-10-CM | POA: Insufficient documentation

## 2021-10-10 LAB — ETHANOL: Alcohol, Ethyl (B): <10 mg/dL (ref <10)

## 2021-10-10 LAB — PROTIME-INR
INR: 1.1 (ref 0.8–1.2)
Prothrombin Time: 13.7 seconds (ref 11.4–15.2)

## 2021-10-10 LAB — COMPREHENSIVE METABOLIC PANEL
ALT: 11 U/L (ref 0–44)
AST: 16 U/L (ref 15–41)
Albumin: 3.5 g/dL (ref 3.5–5.0)
Alkaline Phosphatase: 104 U/L (ref 38–126)
Anion gap: 9 (ref 5–15)
BUN: 21 mg/dL (ref 8–23)
CO2: 20 mmol/L — ABNORMAL LOW (ref 22–32)
Calcium: 8.4 mg/dL — ABNORMAL LOW (ref 8.9–10.3)
Chloride: 104 mmol/L (ref 98–111)
Creatinine, Ser: 1.65 mg/dL — ABNORMAL HIGH (ref 0.61–1.24)
GFR, Estimated: 41 mL/min — ABNORMAL LOW (ref >60)
Glucose, Bld: 118 mg/dL — ABNORMAL HIGH (ref 70–99)
Potassium: 3.8 mmol/L (ref 3.5–5.1)
Sodium: 133 mmol/L — ABNORMAL LOW (ref 135–145)
Total Bilirubin: 0.2 mg/dL — ABNORMAL LOW (ref 0.3–1.2)
Total Protein: 6.8 g/dL (ref 6.5–8.1)

## 2021-10-10 LAB — DIFFERENTIAL
Abs Immature Granulocytes: 0.01 10*3/uL (ref 0.00–0.07)
Basophils Absolute: 0 10*3/uL (ref 0.0–0.1)
Basophils Relative: 0 %
Eosinophils Absolute: 0.1 10*3/uL (ref 0.0–0.5)
Eosinophils Relative: 1 %
Immature Granulocytes: 0 %
Lymphocytes Relative: 6 %
Lymphs Abs: 0.4 10*3/uL — ABNORMAL LOW (ref 0.7–4.0)
Monocytes Absolute: 0.5 10*3/uL (ref 0.1–1.0)
Monocytes Relative: 7 %
Neutro Abs: 6 10*3/uL (ref 1.7–7.7)
Neutrophils Relative %: 86 %

## 2021-10-10 LAB — CBC
HCT: 32.8 % — ABNORMAL LOW (ref 39.0–52.0)
Hemoglobin: 10.8 g/dL — ABNORMAL LOW (ref 13.0–17.0)
MCH: 29.9 pg (ref 26.0–34.0)
MCHC: 32.9 g/dL (ref 30.0–36.0)
MCV: 90.9 fL (ref 80.0–100.0)
Platelets: 196 10*3/uL (ref 150–400)
RBC: 3.61 MIL/uL — ABNORMAL LOW (ref 4.22–5.81)
RDW: 16.2 % — ABNORMAL HIGH (ref 11.5–15.5)
WBC: 7 10*3/uL (ref 4.0–10.5)
nRBC: 0 % (ref 0.0–0.2)

## 2021-10-10 LAB — RESP PANEL BY RT-PCR (FLU A&B, COVID) ARPGX2
Influenza A by PCR: NEGATIVE
Influenza B by PCR: NEGATIVE
SARS Coronavirus 2 by RT PCR: NEGATIVE

## 2021-10-10 LAB — CBG MONITORING, ED: Glucose-Capillary: 66 mg/dL — ABNORMAL LOW (ref 70–99)

## 2021-10-10 LAB — APTT: aPTT: 34 seconds (ref 24–36)

## 2021-10-10 MED ORDER — ACETAMINOPHEN 160 MG/5ML PO SOLN: 650.0000 mg | ORAL | Status: DC | PRN

## 2021-10-10 MED ORDER — ACETAMINOPHEN 650 MG RE SUPP: 650.0000 mg | RECTAL | Status: DC | PRN

## 2021-10-10 MED ORDER — LEVOTHYROXINE SODIUM 25 MCG PO TABS
25.0000 ug | ORAL_TABLET | Freq: Every day | ORAL | Status: DC
  Administered 2021-10-11: 06:00:00 25 ug via ORAL
  Filled 2021-10-10: qty 1

## 2021-10-10 MED ORDER — STROKE: EARLY STAGES OF RECOVERY BOOK
Freq: Once | Status: AC
  Filled 2021-10-10: qty 1

## 2021-10-10 MED ORDER — ASPIRIN 325 MG PO TABS
325.0000 mg | ORAL_TABLET | Freq: Every day | ORAL | Status: DC
  Administered 2021-10-11: 11:00:00 325 mg via ORAL
  Filled 2021-10-10: qty 1

## 2021-10-10 MED ORDER — ACETAMINOPHEN 325 MG PO TABS: 650.0000 mg | ORAL_TABLET | ORAL | Status: DC | PRN

## 2021-10-10 MED ORDER — ASPIRIN 81 MG PO CHEW
324.0000 mg | CHEWABLE_TABLET | Freq: Once | ORAL | Status: AC
  Administered 2021-10-10: 18:00:00 324 mg via ORAL
  Filled 2021-10-10: qty 4

## 2021-10-10 MED ORDER — LEVOTHYROXINE SODIUM 50 MCG PO TABS: 25.0000 ug | ORAL_TABLET | Freq: Every day | ORAL | Status: DC

## 2021-10-10 NOTE — Progress Notes (Incomplete)
Code stroke time documentation  1525 Call time 1526 Beeper time 1535 Exam Started 1538 Exam Finished 1540 Images sent to [facility] 1544 Exam completed in Epic 1541 [facility] called

## 2021-10-10 NOTE — H&P (Signed)
History and Physical  Steven Krueger [id number] DOB: Mar 03, 1935 DOA: 10/10/2021  Referring physician: Noemi Chapel, MD PCP: Steven Halim., PA-C  Patient coming from: Home  Chief Complaint: Right arm numbness and near syncope  HPI: Steven Krueger. is a 85 y.o. male with medical history significant for HFrEF (echo done on 07/29/2020 showed LVEF of 35 to 40%), complete heart block s/p pacemaker, nonischemic cardiomyopathy, hypertension, hypothyroidism, CKD stage 3B, gout who presents to the emergency department due to sudden onset of right arm weakness while helping out in the kitchen, patient also felt lightheaded and felt like passing out.  Last well-known was 2:30 PM.  Son who witnessed patient's presentation activated EMS, and on arrival of EMS team, blood pressure checked was 90 systolic. En route to the hospital, he complained of numbness and tingling sensation of right fingers.  He denies chest pain, shortness of breath, slurred speech, fever, chills, nausea or vomiting.  ED Course:  In the emergency department, patient was hemodynamically stable.  Work-up in the ED showed normocytic anemia, mild hyponatremia, BUNs/creatinine 21/1.65 (creatinine is within baseline range), alcohol level was negative.  A, B, SARS coronavirus 2 was negative.  CBG on arrival was 66 (juice was given). CT of head without contrast showed no evidence of acute intracranial abnormality Telemetry neurology was consulted and recommended further stroke work-up.  Hospitalist was asked to admit patient for further evaluation and management.  Review of Systems: Constitutional: Negative for chills and fever.  HENT: Negative for ear pain and sore throat.   Eyes: Negative for pain and visual disturbance.  Respiratory: Negative for cough, chest tightness and shortness of breath.   Cardiovascular: Positive for near fainting episode.  Negative for chest pain and palpitations.  Gastrointestinal: Negative for  abdominal pain and vomiting.  Endocrine: Negative for polyphagia and polyuria.  Genitourinary: Negative for decreased urine volume, dysuria, enuresis Musculoskeletal: Negative for arthralgias and back pain.  Skin: Negative for color change and rash.  Allergic/Immunologic: Negative for immunocompromised state.  Neurological: Positive for right arm weakness and right hand numbness and tingling.  Negative for tremors, syncope, speech difficulty Hematological: Does not bruise/bleed easily.  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   Right bundle, left anterior hemiblock, and first degree AV block, June, 2015    Ventricular bigeminy 06/27/2014   Past Surgical History:  Procedure Laterality Date   appendectomy     COLONOSCOPY WITH PROPOFOL N/A 04/09/2016   Procedure: COLONOSCOPY WITH PROPOFOL;  Surgeon: Carol Ada, MD;  Location: [facility];  Service: Endoscopy;  Laterality: N/A;   PACEMAKER IMPLANT N/A 07/27/2019   Procedure: PACEMAKER IMPLANT;  Surgeon: Evans Lance, MD;  Location: [place] [facility];  Service: Cardiovascular;  Laterality: N/A;   TEMPORARY PACEMAKER N/A 07/26/2019   Procedure: TEMPORARY PACEMAKER;  Surgeon: Burnell Blanks, MD;  Location: [place] [facility];  Service: Cardiovascular;  Laterality: N/A;    Social History:  reports that he has quit smoking.  His smokeless tobacco use includes chew. He reports that he does not drink alcohol and does not use drugs.   No Known  Allergies  Family History  Problem Relation Age of Onset   CAD Mother    Cancer Father    Heart disease Other    Hypertension Other    Cancer Other      Prior to Admission medications   Medication Sig Start Date End Date Taking? Authorizing Provider  allopurinol (ZYLOPRIM) 300 MG tablet TAKE ONE TABLET BY MOUTH EVERY DAY Patient taking differently: Take 300 mg by mouth daily. 06/02/17  Yes Darreld Mclean, MD  aspirin EC 81 MG tablet Take 1 tablet (81 mg total) by mouth daily. 04/19/17  Yes Camnitz, Will Daphine Deutscher, MD  carvedilol (COREG) 6.25 MG tablet Take 1 tablet (6.25 mg total) by mouth 2 (two) times daily. 10/03/20  Yes Sheilah Pigeon, PA-C  Cholecalciferol (VITAMIN D3) 10 MCG (400 UNIT) CHEW Chew 10 mcg by mouth daily.   Yes [provider]  levothyroxine (SYNTHROID, LEVOTHROID) 25 MCG tablet Take 25 mcg by mouth daily.   Yes [provider]  losartan-hydrochlorothiazide (HYZAAR) 50-12.5 MG tablet TAKE ONE TABLET BY MOUTH DAILY Patient taking differently: Take 1 tablet by mouth daily. 07/10/20  Yes Lyn Records, MD    Physical Exam: BP 126/64    Pulse 63    Temp 97.8 F (36.6 C) (Oral)    Resp (!) 21    Ht 5\' 10"  (1.778 m)    Wt 79.4 kg    SpO2 97%    BMI 25.11 kg/m   General: 86 y.o. year-old male well developed well nourished in no acute distress.  Alert and oriented x3. HEENT: NCAT, EOMI Neck: Supple, trachea medial Cardiovascular: Regular rate and rhythm with no rubs or gallops.  No thyromegaly or JVD noted.  No lower extremity edema. 2/4 pulses in all 4 extremities. Respiratory: Clear to auscultation with no wheezes or rales. Good inspiratory effort. Abdomen: Soft, nontender nondistended with normal bowel sounds x4 quadrants. Muskuloskeletal: No cyanosis, clubbing or edema noted bilaterally Neuro: CN II-XII intact, strength 5/5 x 4, sensation, reflexes intact Skin: No ulcerative lesions noted or rashes Psychiatry: Judgement and insight appear normal.  Mood is appropriate for condition and setting          Labs on Admission:  Basic Metabolic Panel: Recent Labs  Lab 10/10/21 1547  NA 133*  K 3.8  CL 104  CO2 20*  GLUCOSE 118*  BUN 21  CREATININE 1.65*  CALCIUM 8.4*   Liver Function Tests: Recent Labs  Lab 10/10/21 1547  AST 16  ALT 11  ALKPHOS 104  BILITOT 0.2*  PROT 6.8  ALBUMIN 3.5   No results for input(s): LIPASE, AMYLASE in the last 168 hours. No results for input(s): AMMONIA in the last 168 hours. CBC: Recent Labs  Lab 10/10/21 1547  WBC 7.0  NEUTROABS 6.0  HGB 10.8*  HCT 32.8*  MCV 90.9  PLT 196   Cardiac Enzymes: No results for input(s): CKTOTAL, CKMB, CKMBINDEX, TROPONINI in the last 168 hours.  BNP (last 3 results) No results for input(s): BNP in the last 8760 hours.  ProBNP (last 3 results) No results for input(s): PROBNP in the last 8760 hours.  CBG: Recent Labs  Lab 10/10/21 1512  GLUCAP 66*    Radiological Exams on Admission: CT HEAD CODE STROKE WO CONTRAST  Result Date: 10/10/2021 CLINICAL DATA:  Code stroke. Provided history: Neuro deficit, acute, stroke suspected.  Right arm weakness. EXAM: CT HEAD WITHOUT CONTRAST TECHNIQUE: Contiguous axial images were obtained from the base of the skull through the vertex without intravenous contrast. COMPARISON:  Prior head CT 07/23/2021. FINDINGS: Brain: Mild generalized cerebral atrophy. Redemonstrated chronic small vessel infarcts within the right corona radiata, right basal ganglia and bilateral thalami. Background moderate patchy and ill-defined hypoattenuation within the cerebral white matter, nonspecific but compatible chronic small vessel ischemic disease. There is no acute intracranial hemorrhage. No demarcated cortical infarct. No extra-axial fluid collection. No evidence of an intracranial mass. No midline shift. Vascular: No hyperdense vessel.  Atherosclerotic calcifications Skull: Normal. Negative for fracture or focal lesion.  Sinuses/Orbits: Visualized orbits show no acute finding. Trace mucosal thickening within the bilateral ethmoid sinuses. ASPECTS [facility] Stroke Program Early CT Score) - Ganglionic level infarction (caudate, lentiform nuclei, internal capsule, insula, M1-M3 cortex): 7 - Supraganglionic infarction (M4-M6 cortex): 3 Total score (0-10 with 10 being normal): 10 (when discounting chronic infarcts). These results were called by telephone at the time of interpretation on 10/10/2021 at 3:52 pm to provider [facility] , who verbally acknowledged these results. IMPRESSION: No evidence of acute intracranial abnormality. Redemonstrated chronic small-vessel infarcts within the right corona radiata, right basal ganglia and bilateral thalami. Background moderate cerebral white matter chronic small vessel ischemic disease. Mild generalized cerebral atrophy. Electronically Signed   By: Kellie Simmering D.O.   On: 10/10/2021 15:53    EKG: I independently viewed the EKG done and my findings are as followed: Ventricular paced rhythm at a rate of 60 bpm.  QTc (514 ms)  Assessment/Plan Present on Admission:  Right arm numbness  Essential hypertension  Complete heart block (HCC)  Principal Problem:   Right arm numbness Active Problems:   Essential hypertension   Adult hypothyroidism   CKD (chronic kidney disease), stage III (HCC)   Complete heart block (HCC)   Near syncope   Chronic HFrEF (heart failure with reduced ejection fraction) (HCC)   NICM (nonischemic cardiomyopathy) (HCC)  Right arm weakness and right hand numbness and tingling, rule out acute ischemic stroke vs TIA This is possibly TIA since patient's right arm weakness, numbness and tingling have resolved CT of head without contrast showed no evidence of acute intracranial abnormality Aspirin 324 mg x 1 was given Teleneurology was consulted and recommended further stroke work-up Patient will be admitted to telemetry unit  Bilateral carotid ultrasound in  the morning Echocardiogram in the morning MRI of brain cannot be done due to patient having a pacemaker.  CT of head without contrast will be repeated in 24 hours Continue aspirin 324mg  Continue fall precautions and neuro checks Lipid panel and hemoglobin A1c will be checked Continue PT/OT eval and treat Consider SLP if patient fails bedside swallow eval Bedside swallow eval by nursing prior to diet Consider tele neurology consult status post imaging studies  Near syncope Continue telemetry and watch for arrhythmias EKG showed personally reviewed showed ventricular paced rhythm at rate of 60 bpm Echocardiogram done on 07/29/2020 showed LVEF of 35 to 40%, LV demonstrates global hypokinesis. Echocardiogram will be done to rule out significant aortic stenosis or other outflow obstruction, and also to evaluate EF and to rule out segmental/Regional wall motion abnormalities.  Carotid artery Dopplers will be done to rule out hemodynamically significant stenosis  Prolonged QTc (514 ms) This may not be reliable considering the patient is ventricular paced Continue telemetry  Chronic HFrEF Nonischemic cardiomyopathy Coreg will be temporarily held at this time due to need for permissive  hypertension  Complete heart block Patient has a pacemaker last reviewed on 07/31/2021  Hypothyroidism Continue Synthroid  CKD stage 3B BUNs/creatinine 21/1.65 (creatinine is within baseline range)  DVT prophylaxis: SCDs (consider starting chemoprophylaxis based on findings on repeat CT scan of head)   Code Status: Full code  Family Communication: Son at bedside (all questions answered to satisfaction)  Disposition Plan:  Patient is from:                        home Anticipated DC to:                   SNF or family members home Anticipated DC date:               2-3 days Anticipated DC barriers:          Patient requires inpatient management for further stroke work-up  Consults called:  Neurology  Admission status: Observation    Bernadette Hoit MD [facility]  10/10/2021, 6:17 PM

## 2021-10-10 NOTE — ED Triage Notes (Signed)
Per pt he was sitting at table and right arm went numb with blurred vision that has resolved. Ems arrived pt felt like he was going to pass out and bp was 90/45.

## 2021-10-10 NOTE — Consult Note (Addendum)
Stroke Neurology Consultation Note  Consult Requested by: Dr. Hyacinth Meeker  Reason for Consult: code stroke. Location of the provider: [facility] Location of the patient: [name]  Consult Date: 10/10/21  Date of service: October 10, 2021 Patient Name: Steven GLEAVES Sr. MRN:  [id number] DOB:  July 23, 1935  This consult was provided via telemedicine with 2-way video and audio communication. The patient/family was informed that care would be provided in this way and agreed to receive care in this manner.   History obtained by patient and his son via teleneuro logged in at 15:50.  History of Present Illness:  Steven Levick. is a 86 y.o. African American male with PMH of HTN, previous stroke, pacemaker, CKD 3, HLD.  Had abrupt onset of right arm numbness while helping out in the kitchen. He did not have weakness but stopped working immediately.  No vision loss, no speech loss, no weakness. His symptoms rapidly improved while in the ED even before his blood glucose was taken.  He did not eat breakfast this morning. BG was 66 on arrival. He denies having diabetes. Numbness in now only in the fingertips. He was noted to be cold in the exam room. He has right knee pain and walks with a walker due to this. He is reported to have residual weakness/difficulty walking after his previous stroke but it appears it's mostly from his knee pain.  LSN: 13:45 tPA Given: No: Rapidly improving symptoms with minimal deficits.   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   Right  bundle, left anterior hemiblock, and first degree AV block, June, 2015    Ventricular bigeminy 06/27/2014    Past Surgical History:  Procedure Laterality Date   appendectomy     COLONOSCOPY WITH PROPOFOL N/A 04/09/2016   Procedure: COLONOSCOPY WITH PROPOFOL;  Surgeon: [name], MD;  Location: [facility];  Service: Endoscopy;  Laterality: N/A;   PACEMAKER IMPLANT N/A 07/27/2019   Procedure: PACEMAKER IMPLANT;  Surgeon: Marinus Maw, MD;  Location: [facility];  Service: Cardiovascular;  Laterality: N/A;   TEMPORARY PACEMAKER N/A 07/26/2019   Procedure: TEMPORARY PACEMAKER;  Surgeon: Kathleene Hazel, MD;  Location: [facility];  Service: Cardiovascular;  Laterality: N/A;    Family History  Problem Relation Age of Onset   CAD Mother    Cancer Father    Heart disease Other    Hypertension Other    Cancer Other     Social History:  reports that he has quit smoking. His smokeless tobacco use includes chew. He reports that he does not drink alcohol and does not use drugs.  Allergies: No Known Allergies  No current facility-administered medications on file prior to encounter.   Current Outpatient Medications on File Prior to Encounter  Medication Sig Dispense Refill   allopurinol (ZYLOPRIM) 300 MG tablet TAKE ONE TABLET BY MOUTH EVERY DAY (Patient taking differently: Take 300 mg by mouth daily.) 30 tablet 5   aspirin EC 81 MG tablet Take 1 tablet (81 mg  total) by mouth daily. 90 tablet 3   carvedilol (COREG) 6.25 MG tablet Take 1 tablet (6.25 mg total) by mouth 2 (two) times daily. 180 tablet 2   Cholecalciferol (VITAMIN D3) 10 MCG (400 UNIT) CHEW Chew 10 mcg by mouth daily.     levothyroxine (SYNTHROID, LEVOTHROID) 25 MCG tablet Take 25 mcg by mouth daily.     losartan-hydrochlorothiazide (HYZAAR) 50-12.5 MG tablet TAKE ONE TABLET BY MOUTH DAILY (Patient taking differently: Take 1 tablet by mouth daily.) 90 tablet 3    Review of Systems: A full ROS was  attempted today and was able to be performed.  Systems assessed include - Constitutional, Eyes, HENT, Respiratory, Cardiovascular, Gastrointestinal, Genitourinary, IntegumentHematologic/lymphatic, Musculoskeletal, Neurological, Behavioral/Psych, Endocrine, Allergic/Immunologic - with pertinent responses as per HPI.  Physical Examination: Temp:  [97.8 F (36.6 C)] 97.8 F (36.6 C) (12/24 1509) Pulse Rate:  [60] 60 (12/24 1509) Resp:  [19] 19 (12/24 1509) BP: (118)/(64) 118/64 (12/24 1509) SpO2:  [96 %] 96 % (12/24 1509) Weight:  [79.4 kg] 79.4 kg (12/24 1507)  Exam performed over telemedicine with 2-way video and audio communication and with assistance of bedside RN  General -  A&O x4, NAD Resp: normal WOB CV: extremities appear well-perfused. Joking in the exam room and in a good spirit.   Mental Status -  Level of arousal and orientation to time, place, and person were intact. Language including expression, naming, repetition, comprehension found intact. Attention span and concentration were normal. Recent and remote memory were intact.   Cranial Nerves II - XII - II - Vision intact OU. No field cut. III, IV, VI - Extraocular movements intact. V - Facial sensation intact bilaterally VII - Facial movement intact bilaterally VIII - Hearing & vestibular intact bilaterally XI - Chin turning & shoulder shrug intact bilaterally XII - Tongue protrusion intact  Motor Strength - The patients strength was normal in all extremities and pronator drift was absent.  Right leg he lifted up briefly but only limited due to knee pain which is chronic.  He reports no new weakness in his right leg.  Motor Tone & Bulk - Muscle tone was assessed at the neck and appendages and was normal.    Sensory - Light touch were assessed and were normal.  Normal DSS.  Reflexes: UTA 2/2 tele-exam  Coordination - The patient had normal movements in the hands and feet with no ataxia or dysmetria.  Tremor was  absent  Gait and Station - deferred   NIHSS  1a Level of Conscious.: 0 1b LOC Questions: 0 1c LOC Commands: 0 2 Best Gaze: 0 3 Visual: 0 4 Facial Palsy: 0 5a Motor Arm - left: 0 5b Motor Arm - Right: 0 6a Motor Leg - Left: 0 6b Motor Leg - Right: 2 (chronic and limited due to knee pain) 7 Limb Ataxia: 0 8 Sensory:0 9 Best Language: 0 10 Dysarthria: 0 11 Extinct. and Inatten.: 0  TOTAL: 0 Right leg weakness is from knee pain and chronic.  Premorbid mRS = 1. Walks with a walker sometimes due to right knee pain.   Data Reviewed: CT HEAD CODE STROKE WO CONTRAST  Result Date: 10/10/2021 CLINICAL DATA:  Code stroke. Provided history: Neuro deficit, acute, stroke suspected. Right arm weakness. EXAM: CT HEAD WITHOUT CONTRAST TECHNIQUE: Contiguous axial images were obtained from the base of the skull through the vertex without intravenous contrast. COMPARISON:  Prior head CT 07/23/2021. FINDINGS: Brain: Mild generalized cerebral atrophy. Redemonstrated chronic small vessel infarcts  within the right corona radiata, right basal ganglia and bilateral thalami. Background moderate patchy and ill-defined hypoattenuation within the cerebral white matter, nonspecific but compatible chronic small vessel ischemic disease. There is no acute intracranial hemorrhage. No demarcated cortical infarct. No extra-axial fluid collection. No evidence of an intracranial mass. No midline shift. Vascular: No hyperdense vessel.  Atherosclerotic calcifications Skull: Normal. Negative for fracture or focal lesion. Sinuses/Orbits: Visualized orbits show no acute finding. Trace mucosal thickening within the bilateral ethmoid sinuses. ASPECTS [facility] Stroke Program Early CT Score) - Ganglionic level infarction (caudate, lentiform nuclei, internal capsule, insula, M1-M3 cortex): 7 - Supraganglionic infarction (M4-M6 cortex): 3 Total score (0-10 with 10 being normal): 10 (when discounting chronic infarcts). These results  were called by telephone at the time of interpretation on 10/10/2021 at 3:52 pm to provider [facility] , who verbally acknowledged these results. IMPRESSION: No evidence of acute intracranial abnormality. Redemonstrated chronic small-vessel infarcts within the right corona radiata, right basal ganglia and bilateral thalami. Background moderate cerebral white matter chronic small vessel ischemic disease. Mild generalized cerebral atrophy. Electronically Signed   By: Kellie Simmering D.O.   On: 10/10/2021 15:53    Assessment: 86 y.o. male with h/o cva, pacemaker, HTN with abrupt onset of right arm numbness with rapid resolution. Not tPA candidate. He also had low BG 66 on arrival.  Stroke Risk Factors - hyperlipidemia and hypertension, previous stroke.  Head CT reviewed personally by me. Labs reviewed.   Plan: - HgbA1c, fasting lipid panel - MRI  unable due to pacemaker and probably not needed. - PT consult, OT consult, Speech consult - Echocardiogram - Carotid dopplers - Prophylactic therapy-Antiplatelet med: Stat Aspirin - dose 325mg  then daily. - Risk factor modification - Telemetry monitoring - Frequent neuro checks -likely d/c home tomorrow if workup is unremarkable.  Thank you for this consultation and allowing [place] to participate in the care of this patient.   If 7pm- 7am, please page neurology on call as listed in [place].

## 2021-10-10 NOTE — ED Notes (Signed)
Pt BS 66 pt given juice

## 2021-10-10 NOTE — ED Provider Notes (Signed)
[facility] EMERGENCY DEPARTMENT Provider Note   CSN: [id number] Arrival date & time: 10/10/21  1502  An emergency department physician performed an initial assessment on this suspected stroke patient at 28.  History Chief Complaint  Patient presents with   Near Syncope    [place]. is a 86 y.o. male.   Near Syncope  This patient is an 86 year old male history of prior stroke causing some hemiparesis, history of a complete heart block, he currently has a pacemaker.  He has stage III chronic kidney disease hyperlipidemia and hypertension.  He presented to the hospital today after developing acute onset of right arm numbness associated with lightheadedness feeling like he was going to pass out, his last seen normal was approximately 2:30 PM.  The patient denies having any prodromal symptoms prior to this happening but does state that he has not had anything to eat or drink today.  He denies chest pain or palpitations during this encounter and has not had any fevers chills nausea vomiting or diarrhea nor has he had any bowel bleeding, he does not have any swelling of the legs or rashes of the skin.  He does not have a headache and does not have any blurry vision changes in speech or facial droop that he is aware of.  His symptoms lasted approximately 20 minutes and then started to resolve rapidly leaving him with some residual numbness of the right fingers.  He does not have any symptoms on the left side denies any symptoms in the right leg and according to the patient had no difficulty with his vision or speech during the incident.  His son called 38.  Paramedics reported that he had a low blood pressure around 90 systolic, no prehospital blood sugar was measured but on arrival he was noted to be 68 with a normal mental status, he was given juice to drink.    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   Right bundle, left anterior hemiblock, and first degree AV block, June, 2015    Ventricular bigeminy 06/27/2014    Patient Active Problem List   Diagnosis Date Noted   Pacemaker 10/30/2019   Complete heart block ([place]) 07/26/2019   Excessive thirst 12/19/2015   Hyperlipemia 12/19/2015   Knee pain 12/19/2015   Malaise and fatigue 12/19/2015   Multiple and bilateral precerebral artery stenosis 12/19/2015   Numbness and tingling of foot 12/19/2015   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/19/2015   Sensation of cold in lower extremity 12/19/2015   Shoulder injury 12/19/2015   Sinus bradycardia 12/19/2015   Osteoarthritis of knee 12/19/2015   Allergic rhinitis 11/14/2015   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   Bradycardia 06/27/2014   Bigeminy 06/27/2014   Ventricular bigeminy 06/27/2014   Essential hypertension 04/01/2014   Hypothyroidism 04/01/2014   CVA, old, hemiparesis ([place]) 04/01/2014   Stage 3 chronic kidney disease ([place]) 04/01/2014   Osteoarthritis of right knee 04/01/2014   Hemiplegia ([place]) 04/01/2014    Past Surgical History:  Procedure Laterality Date   appendectomy  COLONOSCOPY WITH PROPOFOL N/A 04/09/2016   Procedure: COLONOSCOPY WITH PROPOFOL;  Surgeon: Carol Ada, MD;  Location: [facility];  Service: Endoscopy;  Laterality: N/A;   PACEMAKER IMPLANT N/A 07/27/2019   Procedure: PACEMAKER IMPLANT;  Surgeon: Evans Lance, MD;  Location: [facility];  Service: Cardiovascular;  Laterality: N/A;   TEMPORARY PACEMAKER N/A 07/26/2019   Procedure: TEMPORARY PACEMAKER;  Surgeon: Burnell Blanks, MD;   Location: [facility];  Service: Cardiovascular;  Laterality: N/A;       Family History  Problem Relation Age of Onset   CAD Mother    Cancer Father    Heart disease Other    Hypertension Other    Cancer Other     Social History   Tobacco Use   Smoking status: Former   Smokeless tobacco: Current    Types: Chew  Vaping Use   Vaping Use: Never used  Substance Use Topics   Alcohol use: No   Drug use: No    Home Medications Prior to Admission medications   Medication Sig Start Date End Date Taking? Authorizing Provider  allopurinol (ZYLOPRIM) 300 MG tablet TAKE ONE TABLET BY MOUTH EVERY DAY Patient taking differently: Take 300 mg by mouth daily. 06/02/17  Yes Sanjuana Kava, MD  aspirin EC 81 MG tablet Take 1 tablet (81 mg total) by mouth daily. 04/19/17  Yes Camnitz, Will Hassell Done, MD  carvedilol (COREG) 6.25 MG tablet Take 1 tablet (6.25 mg total) by mouth 2 (two) times daily. 10/03/20  Yes Baldwin Jamaica, PA-C  Cholecalciferol (VITAMIN D3) 10 MCG (400 UNIT) CHEW Chew 10 mcg by mouth daily.   Yes [provider]  levothyroxine (SYNTHROID, LEVOTHROID) 25 MCG tablet Take 25 mcg by mouth daily.   Yes [provider]  losartan-hydrochlorothiazide (HYZAAR) 50-12.5 MG tablet TAKE ONE TABLET BY MOUTH DAILY Patient taking differently: Take 1 tablet by mouth daily. 07/10/20  Yes Belva Crome, MD    Allergies    Patient has no known allergies.  Review of Systems   Review of Systems  Cardiovascular:  Positive for near-syncope.  All other systems reviewed and are negative.  Physical Exam Updated Vital Signs BP 126/64    Pulse 63    Temp 97.8 F (36.6 C) (Oral)    Resp (!) 21    Ht 1.778 m (5\' 10" )    Wt 79.4 kg    SpO2 97%    BMI 25.11 kg/m   Physical Exam Vitals and nursing note reviewed.  Constitutional:      General: He is not in acute distress.    Appearance: He is well-developed.  HENT:     Head: Normocephalic and atraumatic.      Mouth/Throat:     Pharynx: No oropharyngeal exudate.  Eyes:     General: No scleral icterus.       Right eye: No discharge.        Left eye: No discharge.     Conjunctiva/sclera: Conjunctivae normal.     Pupils: Pupils are equal, round, and reactive to light.  Neck:     Thyroid: No thyromegaly.     Vascular: No JVD.  Cardiovascular:     Rate and Rhythm: Normal rate and regular rhythm.     Heart sounds: Normal heart sounds. No murmur heard.   No friction rub. No gallop.  Pulmonary:     Effort: Pulmonary effort is normal. No respiratory distress.     Breath sounds: Normal breath sounds.  No wheezing or rales.  Abdominal:     General: Bowel sounds are normal. There is no distension.     Palpations: Abdomen is soft. There is no mass.     Tenderness: There is no abdominal tenderness.  Musculoskeletal:        General: No tenderness. Normal range of motion.     Cervical back: Normal range of motion and neck supple.  Lymphadenopathy:     Cervical: No cervical adenopathy.  Skin:    General: Skin is warm and dry.     Findings: No erythema or rash.  Neurological:     Mental Status: He is alert.     Coordination: Coordination normal.     Comments: The patient has a clear sensorium and is able to follow all of my commands including sitting up by himself in the bed.  He has normal strength in all 4 extremities to grips as well as strength at the shoulders elbows to both extensors and flexors in the grips.  He is able to straight leg raise bilaterally limited by knee pain on the right leg.  He has normal sensation in all 4 extremities except of the distal fingers on the right hand which appears numb and have decreased sensation compared to the left.  Cranial nerves III through XII are intact, his speech is clear he has no facial droop he has normal extraocular movements as well as peripheral visual fields and normal sensation of his face.  There is no facial droop.  Psychiatric:        Behavior:  Behavior normal.    ED Results / Procedures / Treatments   Labs (all labs ordered are listed, but only abnormal results are displayed) Labs Reviewed  CBC - Abnormal; Notable for the following components:      Result Value   RBC 3.61 (*)    Hemoglobin 10.8 (*)    HCT 32.8 (*)    RDW 16.2 (*)    All other components within normal limits  DIFFERENTIAL - Abnormal; Notable for the following components:   Lymphs Abs 0.4 (*)    All other components within normal limits  COMPREHENSIVE METABOLIC PANEL - Abnormal; Notable for the following components:   Sodium 133 (*)    CO2 20 (*)    Glucose, Bld 118 (*)    Creatinine, Ser 1.65 (*)    Calcium 8.4 (*)    Total Bilirubin 0.2 (*)    GFR, Estimated 41 (*)    All other components within normal limits  CBG MONITORING, ED - Abnormal; Notable for the following components:   Glucose-Capillary 66 (*)    All other components within normal limits  RESP PANEL BY RT-PCR (FLU A&B, COVID) ARPGX2  ETHANOL  PROTIME-INR  APTT  RAPID URINE DRUG SCREEN, HOSP PERFORMED  URINALYSIS, ROUTINE W REFLEX MICROSCOPIC  I-STAT CHEM 8, ED    EKG EKG Interpretation  Date/Time:  Saturday October 10 2021 15:11:38 EST Ventricular Rate:  60 PR Interval:    QRS Duration: 214 QT Interval:  514 QTC Calculation: 514 R Axis:   -86 Text Interpretation: VENTRICULAR PACED RHYTHM no changes from prior in 10/22 Confirmed by Eber Hong ([id number]) on 10/10/2021 3:16:13 PM  Radiology CT HEAD CODE STROKE WO CONTRAST  Result Date: 10/10/2021 CLINICAL DATA:  Code stroke. Provided history: Neuro deficit, acute, stroke suspected. Right arm weakness. EXAM: CT HEAD WITHOUT CONTRAST TECHNIQUE: Contiguous axial images were obtained from the base of the skull through the vertex without intravenous  contrast. COMPARISON:  Prior head CT 07/23/2021. FINDINGS: Brain: Mild generalized cerebral atrophy. Redemonstrated chronic small vessel infarcts within the right corona radiata, right  basal ganglia and bilateral thalami. Background moderate patchy and ill-defined hypoattenuation within the cerebral white matter, nonspecific but compatible chronic small vessel ischemic disease. There is no acute intracranial hemorrhage. No demarcated cortical infarct. No extra-axial fluid collection. No evidence of an intracranial mass. No midline shift. Vascular: No hyperdense vessel.  Atherosclerotic calcifications Skull: Normal. Negative for fracture or focal lesion. Sinuses/Orbits: Visualized orbits show no acute finding. Trace mucosal thickening within the bilateral ethmoid sinuses. ASPECTS [facility] Stroke Program Early CT Score) - Ganglionic level infarction (caudate, lentiform nuclei, internal capsule, insula, M1-M3 cortex): 7 - Supraganglionic infarction (M4-M6 cortex): 3 Total score (0-10 with 10 being normal): 10 (when discounting chronic infarcts). These results were called by telephone at the time of interpretation on 10/10/2021 at 3:52 pm to provider [facility] , who verbally acknowledged these results. IMPRESSION: No evidence of acute intracranial abnormality. Redemonstrated chronic small-vessel infarcts within the right corona radiata, right basal ganglia and bilateral thalami. Background moderate cerebral white matter chronic small vessel ischemic disease. Mild generalized cerebral atrophy. Electronically Signed   By: Kellie Simmering D.O.   On: 10/10/2021 15:53    Procedures Procedures   Medications Ordered in ED Medications  aspirin chewable tablet 324 mg (has no administration in time range)    ED Course  I have reviewed the triage vital signs and the nursing notes.  Pertinent labs & imaging results that were available during my care of the patient were reviewed by me and considered in my medical decision making (see chart for details).    MDM Rules/Calculators/A&P                          This patient presents to the ED for concern of possible stroke versus hypotension versus  hypoglycemia versus arrhythmia, this involves an extensive number of treatment options, and is a complaint that carries with it a high risk of complications and morbidity.  The differential diagnosis includes multiple different etiologies including cardiac, neurologic, metabolic, infectious   Additional history obtained:  Additional history obtained from the medical record showing that the patient has had a prior stroke, he does not have any recent MRIs and now has a pacemaker, echocardiogram showed ejection fraction 35 to 40% within the last 2 years External records from outside source obtained and reviewed including the above   Lab Tests:  I Ordered, reviewed, and interpreted labs.  The pertinent results include: Code stroke was activated including labs and CT scan   Imaging Studies ordered:  I ordered imaging studies including CT scan of the brain code stroke I independently visualized and interpreted imaging which showed no acute findings but chronic ischemic stroke findings I agree with the radiologist interpretation   Cardiac Monitoring:  The patient was maintained on a cardiac monitor.  I personally viewed and interpreted the cardiac monitored which showed an underlying rhythm of: Paced rhythm   Medicines ordered and prescription drug management:  I ordered medication including full dose aspirin for likely Reevaluation of the patient after these medicines showed that the patient stayed the same I have reviewed the patients home medicines and have made adjustments as needed   Critical Interventions:  Neurology consultation Activation of code stroke CT scan of the brain   ED Course:  I discussed the case with Dr. Josephine Cables of the hospitalist service  who will admit.  I did discuss the case with neurology and ultimately they recommended admission to the hospital for both a carotid ultrasound and echocardiogram with observation overnight.   Consultations Obtained:  I  requested consultation with the radiologist,  and discussed lab and imaging findings as well as pertinent plan - they recommend: No findings of acute stroke, there are findings of chronic stroke D/w Neuro - they have done the teleneuro consult - they recommend -full dose aspirin, overnight observation, possibly repeat echocardiogram, patient cannot have an MRI due to pacemaker, repeat echocardiogram and ultrasound of carotids requested as well as overnight observation on cardiac monitor.  Full dose aspirin as well   Reevaluation:  After the interventions noted above, I reevaluated the patient and found that they have :stayed the same   Dispostion:  After consideration of the diagnostic results and the patients response to treatment feel that the patent would benefit from admission to the hospital.       Final Clinical Impression(s) / ED Diagnoses Final diagnoses:  Acute ischemic stroke [facility])     Noemi Chapel, MD 10/10/21 1722

## 2021-10-11 ENCOUNTER — Observation Stay (HOSPITAL_BASED_OUTPATIENT_CLINIC_OR_DEPARTMENT_OTHER): Payer: Medicare PPO

## 2021-10-11 ENCOUNTER — Encounter (HOSPITAL_COMMUNITY): Payer: Self-pay | Admitting: Internal Medicine

## 2021-10-11 ENCOUNTER — Observation Stay (HOSPITAL_COMMUNITY): Payer: Medicare PPO

## 2021-10-11 DIAGNOSIS — I10 Essential (primary) hypertension: Secondary | ICD-10-CM | POA: Diagnosis not present

## 2021-10-11 DIAGNOSIS — N1832 Chronic kidney disease, stage 3b: Secondary | ICD-10-CM | POA: Diagnosis not present

## 2021-10-11 DIAGNOSIS — R55 Syncope and collapse: Secondary | ICD-10-CM

## 2021-10-11 DIAGNOSIS — I5022 Chronic systolic (congestive) heart failure: Secondary | ICD-10-CM | POA: Diagnosis not present

## 2021-10-11 LAB — URINALYSIS, ROUTINE W REFLEX MICROSCOPIC
Bilirubin Urine: NEGATIVE
Glucose, UA: NEGATIVE mg/dL
Hgb urine dipstick: NEGATIVE
Ketones, ur: NEGATIVE mg/dL
Leukocytes,Ua: NEGATIVE
Nitrite: NEGATIVE
Protein, ur: NEGATIVE mg/dL
Specific Gravity, Urine: 1.015 (ref 1.005–1.030)
pH: 6 (ref 5.0–8.0)

## 2021-10-11 LAB — RAPID URINE DRUG SCREEN, HOSP PERFORMED
Amphetamines: NOT DETECTED
Barbiturates: NOT DETECTED
Benzodiazepines: NOT DETECTED
Cocaine: NOT DETECTED
Opiates: NOT DETECTED
Tetrahydrocannabinol: NOT DETECTED

## 2021-10-11 LAB — GLUCOSE, CAPILLARY
Glucose-Capillary: 82 mg/dL (ref 70–99)
Glucose-Capillary: 103 mg/dL — ABNORMAL HIGH (ref 70–99)
Glucose-Capillary: 134 mg/dL — ABNORMAL HIGH (ref 70–99)
Glucose-Capillary: 98 mg/dL (ref 70–99)

## 2021-10-11 LAB — ECHOCARDIOGRAM COMPLETE
Area-P 1/2: 2.42 cm2
Height: 70 in
P 1/2 time: 511 msec
S' Lateral: 4.1 cm
Weight: 2680.79 oz

## 2021-10-11 LAB — LIPID PANEL
Cholesterol: 110 mg/dL (ref 0–200)
HDL: 21 mg/dL — ABNORMAL LOW (ref >40)
LDL Cholesterol: 73 mg/dL (ref 0–99)
Total CHOL/HDL Ratio: 5.2 RATIO
Triglycerides: 78 mg/dL (ref <150)
VLDL: 16 mg/dL (ref 0–40)

## 2021-10-11 LAB — HEMOGLOBIN A1C
Hgb A1c MFr Bld: 5.6 % (ref 4.8–5.6)
Mean Plasma Glucose: 114.02 mg/dL

## 2021-10-11 LAB — CBG MONITORING, ED: Glucose-Capillary: 86 mg/dL (ref 70–99)

## 2021-10-11 MED ORDER — ROSUVASTATIN CALCIUM 10 MG PO TABS: 5.0000 mg | ORAL_TABLET | ORAL | Status: DC

## 2021-10-11 MED ORDER — ROSUVASTATIN CALCIUM 5 MG PO TABS: 5.0000 mg | ORAL_TABLET | ORAL | 1 refills | Status: DC

## 2021-10-11 MED ORDER — ASPIRIN 325 MG PO TBEC: 325.0000 mg | DELAYED_RELEASE_TABLET | Freq: Every day | ORAL | Status: DC

## 2021-10-11 MED ORDER — ALLOPURINOL 300 MG PO TABS
300.0000 mg | ORAL_TABLET | Freq: Every day | ORAL | Status: DC
  Administered 2021-10-11: 11:00:00 300 mg via ORAL
  Filled 2021-10-11: qty 1

## 2021-10-11 MED ORDER — OMEPRAZOLE 40 MG PO CPDR: 40.0000 mg | DELAYED_RELEASE_CAPSULE | Freq: Every day | ORAL | 1 refills | Status: DC

## 2021-10-11 MED ORDER — PANTOPRAZOLE SODIUM 40 MG PO TBEC
40.0000 mg | DELAYED_RELEASE_TABLET | Freq: Every day | ORAL | Status: DC
  Administered 2021-10-11: 11:00:00 40 mg via ORAL
  Filled 2021-10-11: qty 1

## 2021-10-11 NOTE — Discharge Summary (Addendum)
Physician Discharge Summary  Steven Krueger WYO:[id number] DOB: [birth year] DOA: 10/10/2021  PCP: Richmond Campbell., PA-C  Admit date: 10/10/2021 Discharge date: 10/11/2021  Admitted From:  Home  Disposition: Home   Recommendations for Outpatient Follow-up:  Follow up with PCP in 1 weeks Follow up with cardiologist as scheduled   Discharge Condition: STABLE   CODE STATUS: FULL DIET: Heart healthy low sodium    Brief Hospitalization Summary: Please see all hospital notes, images, labs for full details of the hospitalization. ADMISSION HPI:  86 y.o. male with medical history significant for HFrEF (echo done on 07/29/2020 showed LVEF of 35 to 40%), complete heart block s/p pacemaker, nonischemic cardiomyopathy, hypertension, hypothyroidism, CKD stage 3B, gout who presents to the emergency department due to sudden onset of right arm weakness while helping out in the kitchen, patient also felt lightheaded and felt like passing out.  Last well-known was 2:30 PM.  Son who witnessed patient's presentation activated EMS, and on arrival of EMS team, blood pressure checked was 90 systolic. En route to the hospital, he complained of numbness and tingling sensation of right fingers.  He denies chest pain, shortness of breath, slurred speech, fever, chills, nausea or vomiting.   ED Course:  In the emergency department, patient was hemodynamically stable.  Work-up in the ED showed normocytic anemia, mild hyponatremia, BUNs/creatinine 21/1.65 (creatinine is within baseline range), alcohol level was negative.  A, B, SARS coronavirus 2 was negative.  CBG on arrival was 66 (juice was given).  CT of head without contrast showed no evidence of acute intracranial abnormality.   Telemetry neurology was consulted and recommended further stroke work-up.  Hospitalist was asked to admit patient for further evaluation and management.  Hospital Course  Pt presented for evaluation of TIA like symptoms but they had  resolved by the time he arrived.  His blood glucose was low at 66 when he had the episode.  Code stroke was called in ED and he had a CT head with no findings of acute ischemia.  He was placed in observation and was monitored in hospital overnight.  He was seen by teleneurologist.  Recommendations were for aspirin 325 mg daily.  His LDL was >70 and he was started on crestor 5 mg three times weekly.  He could not have MRI due to pacemaker but neurologist felt he really did not need to have it anyway.  His echo showed some improvement from prior testing.  His carotid dopplers were reassuring.  He is feeling much better and his symptoms have not returned.  He is stable to discharge home for outpatient follow up with his PCP.  Pt advised to return if symptoms come back or new problem develops.     Discharge Diagnoses:  Principal Problem:   Right arm numbness Active Problems:   Essential hypertension   Adult hypothyroidism   CKD (chronic kidney disease), stage III (HCC)   Complete heart block (HCC)   Near syncope   Chronic HFrEF (heart failure with reduced ejection fraction) (HCC)   NICM (nonischemic cardiomyopathy) (HCC)   Discharge Instructions:  Allergies as of 10/11/2021   No Known Allergies      Medication List     TAKE these medications    allopurinol 300 MG tablet Commonly known as: ZYLOPRIM TAKE ONE TABLET BY MOUTH EVERY DAY   aspirin 325 MG EC tablet Take 1 tablet (325 mg total) by mouth daily. What changed:  medication strength how much to take   carvedilol  6.25 MG tablet Commonly known as: COREG Take 1 tablet (6.25 mg total) by mouth 2 (two) times daily.   levothyroxine 25 MCG tablet Commonly known as: SYNTHROID Take 25 mcg by mouth daily.   losartan-hydrochlorothiazide 50-12.5 MG tablet Commonly known as: HYZAAR TAKE ONE TABLET BY MOUTH DAILY   omeprazole 40 MG capsule Commonly known as: PRILOSEC Take 1 capsule (40 mg total) by mouth daily.   rosuvastatin 5  MG tablet Commonly known as: CRESTOR Take 1 tablet (5 mg total) by mouth 3 (three) times a week. Start taking on: October 12, 2021   Vitamin D3 10 MCG (400 UNIT) Chew Chew 10 mcg by mouth daily.        Follow-up Information     Richmond Campbell., PA-C. Schedule an appointment as soon as possible for a visit in 1 week(s).   Specialty: Family Medicine Why: Hospital Follow Up Contact information: [address] [place] [address] [phone]         Regan Lemming, MD .   Specialty: Cardiology Contact information: [address] [place] [address] [phone]         Lyn Records, MD .   Specialty: Cardiology Contact information: [phone] [address] [place] [address] [phone]                No Known Allergies Allergies as of 10/11/2021   No Known Allergies      Medication List     TAKE these medications    allopurinol 300 MG tablet Commonly known as: ZYLOPRIM TAKE ONE TABLET BY MOUTH EVERY DAY   aspirin 325 MG EC tablet Take 1 tablet (325 mg total) by mouth daily. What changed:  medication strength how much to take   carvedilol 6.25 MG tablet Commonly known as: COREG Take 1 tablet (6.25 mg total) by mouth 2 (two) times daily.   levothyroxine 25 MCG tablet Commonly known as: SYNTHROID Take 25 mcg by mouth daily.   losartan-hydrochlorothiazide 50-12.5 MG tablet Commonly known as: HYZAAR TAKE ONE TABLET BY MOUTH DAILY   omeprazole 40 MG capsule Commonly known as: PRILOSEC Take 1 capsule (40 mg total) by mouth daily.   rosuvastatin 5 MG tablet Commonly known as: CRESTOR Take 1 tablet (5 mg total) by mouth 3 (three) times a week. Start taking on: October 12, 2021   Vitamin D3 10 MCG (400 UNIT) Chew Chew 10 mcg by mouth daily.        Procedures/Studies: US Carotid Bilateral (at [facility] and [facility] only)  Result Date: 10/11/2021 CLINICAL DATA:  Right arm weakness. Near syncopal  episode. History of hypertension and hyperlipidemia. Former smoker. EXAM: BILATERAL CAROTID DUPLEX ULTRASOUND TECHNIQUE: Wallace Cullens scale imaging, color Doppler and duplex ultrasound were performed of bilateral carotid and vertebral arteries in the neck. COMPARISON:  10/30/2009 FINDINGS: Criteria: Quantification of carotid stenosis is based on velocity parameters that correlate the residual internal carotid diameter with NASCET-based stenosis levels, using the diameter of the distal internal carotid lumen as the denominator for stenosis measurement. The following velocity measurements were obtained: RIGHT ICA: 64/18 cm/sec CCA: 132/11 cm/sec SYSTOLIC ICA/CCA RATIO:  0.5 ECA: 71 cm/sec LEFT ICA: 63/18 cm/sec CCA: 93/8 cm/sec SYSTOLIC ICA/CCA RATIO:  0.7 ECA: 114 cm/sec RIGHT CAROTID ARTERY: There is no grayscale evidence of significant intimal thickening or atherosclerotic plaque affecting the interrogated portions of the right carotid system. There are no elevated peak systolic velocities within the interrogated course of the right internal  carotid artery to suggest a hemodynamically significant stenosis. RIGHT VERTEBRAL ARTERY:  Antegrade flow LEFT CAROTID ARTERY: There is a minimal amount of eccentric echogenic plaque within the left carotid bulb (images 48 and 50), slightly progressed compared to remote examination performed in 2011 though not resulting in elevated peak systolic velocities within the interrogated course of the left internal carotid artery to suggest a hemodynamically significant stenosis. LEFT VERTEBRAL ARTERY:  Antegrade flow IMPRESSION: 1. Minimal amount of left-sided atherosclerotic plaque, slightly progressed compared to remote examination performed in 2011 though not resulting in a hemodynamically significant stenosis. 2. Unremarkable sonographic evaluation of the right carotid system. Electronically Signed   By: Sandi Mariscal M.D.   On: 10/11/2021 10:22   ECHOCARDIOGRAM COMPLETE  Result Date:  10/11/2021    ECHOCARDIOGRAM REPORT   Patient Name:   Steven QUIGG Sr. Date of Exam: 10/11/2021 Medical Rec #:  [id number]        Height:       70.0 in Accession #:    [id number]       Weight:       167.5 lb Date of Birth:  Mar 14, 1935        BSA:          1.936 m Patient Age:    89 years         BP:           118/65 mmHg Patient Gender: M                HR:           55 bpm. Exam Location:  [facility] Procedure: 2D Echo, Cardiac Doppler and Color Doppler Indications:    Stroke I63.9  History:        Patient has prior history of Echocardiogram examinations, most                 recent 09/28/2020. Pacemaker, Stroke; Risk Factors:Hypertension.                 Trifascicular block, CKD.  Sonographer:    Alvino Chapel RCS Referring Phys: [id number] OLADAPO ADEFESO  Sonographer Comments: Suboptimal subcostal window. IMPRESSIONS  1. Left ventricular ejection fraction, by estimation, is 45 to 50%. The left ventricle has mildly decreased function. The left ventricle demonstrates global hypokinesis, distal anteroseptal and apical wall motion likely secondary to RV pacing. There is moderate left ventricular hypertrophy. Left ventricular diastolic parameters are consistent with Grade I diastolic dysfunction (impaired relaxation).  2. Right ventricular systolic function is normal. The right ventricular size is normal. Tricuspid regurgitation signal is inadequate for assessing PA pressure.  3. Left atrial size was severely dilated.  4. The mitral valve is grossly normal. Mild to moderate mitral valve regurgitation.  5. The aortic valve is tricuspid. There is mild calcification of the aortic valve. Aortic valve regurgitation is mild. Aortic valve sclerosis/calcification is present, without any evidence of aortic stenosis. Aortic regurgitation PHT measures 511 msec.  6. Unable to estimate CVP. Comparison(s): Prior images reviewed side by side. LVEF is mildly reduced at this point. FINDINGS  Left Ventricle: Left ventricular ejection  fraction, by estimation, is 45 to 50%. The left ventricle has mildly decreased function. The left ventricle demonstrates global hypokinesis. The left ventricular internal cavity size was normal in size. There is  moderate left ventricular hypertrophy. Abnormal (paradoxical) septal motion, consistent with RV pacemaker. Left ventricular diastolic parameters are consistent with Grade I diastolic dysfunction (impaired relaxation). Right Ventricle: The right ventricular  size is normal. No increase in right ventricular wall thickness. Right ventricular systolic function is normal. Tricuspid regurgitation signal is inadequate for assessing PA pressure. Left Atrium: Left atrial size was severely dilated. Right Atrium: Right atrial size was normal in size. Pericardium: There is no evidence of pericardial effusion. Mitral Valve: The mitral valve is grossly normal. Mild to moderate mitral valve regurgitation. Tricuspid Valve: The tricuspid valve is grossly normal. Tricuspid valve regurgitation is trivial. Aortic Valve: The aortic valve is tricuspid. There is mild calcification of the aortic valve. There is moderate aortic valve annular calcification. Aortic valve regurgitation is mild. Aortic regurgitation PHT measures 511 msec. Aortic valve sclerosis/calcification is present, without any evidence of aortic stenosis. Pulmonic Valve: The pulmonic valve was not well visualized. Pulmonic valve regurgitation is trivial. Aorta: The aortic root is normal in size and structure. Venous: Unable to estimate CVP. The inferior vena cava was not well visualized. IAS/Shunts: No atrial level shunt detected by color flow Doppler. Additional Comments: A device lead is visualized.  LEFT VENTRICLE PLAX 2D LVIDd:         5.50 cm   Diastology LVIDs:         4.10 cm   LV e' medial:    3.48 cm/s LV PW:         1.30 cm   LV E/e' medial:  14.4 LV IVS:        1.30 cm   LV e' lateral:   4.79 cm/s LVOT diam:     1.70 cm   LV E/e' lateral: 10.5 LV SV:          49 LV SV Index:   25 LVOT Area:     2.27 cm  RIGHT VENTRICLE RV S prime:     11.60 cm/s TAPSE (M-mode): 2.1 cm LEFT ATRIUM              Index        RIGHT ATRIUM           Index LA diam:        3.50 cm  1.81 cm/m   RA Area:     17.00 cm LA Vol (A2C):   80.0 ml  41.32 ml/m  RA Volume:   34.30 ml  17.72 ml/m LA Vol (A4C):   118.0 ml 60.95 ml/m LA Biplane Vol: 103.0 ml 53.20 ml/m  AORTIC VALVE LVOT Vmax:   96.10 cm/s LVOT Vmean:  65.600 cm/s LVOT VTI:    0.214 m AI PHT:      511 msec  AORTA Ao Root diam: 3.80 cm MITRAL VALVE MV Area (PHT): 2.42 cm    SHUNTS MV Decel Time: 313 msec    Systemic VTI:  0.21 m MV E velocity: 50.10 cm/s  Systemic Diam: 1.70 cm MV A velocity: 98.50 cm/s MV E/A ratio:  0.51 Nona Dell MD Electronically signed by Nona Dell MD Signature Date/Time: 10/11/2021/12:39:27 PM    Final    CT HEAD CODE STROKE WO CONTRAST  Result Date: 10/10/2021 CLINICAL DATA:  Code stroke. Provided history: Neuro deficit, acute, stroke suspected. Right arm weakness. EXAM: CT HEAD WITHOUT CONTRAST TECHNIQUE: Contiguous axial images were obtained from the base of the skull through the vertex without intravenous contrast. COMPARISON:  Prior head CT 07/23/2021. FINDINGS: Brain: Mild generalized cerebral atrophy. Redemonstrated chronic small vessel infarcts within the right corona radiata, right basal ganglia and bilateral thalami. Background moderate patchy and ill-defined hypoattenuation within the cerebral white matter, nonspecific but compatible chronic small vessel  ischemic disease. There is no acute intracranial hemorrhage. No demarcated cortical infarct. No extra-axial fluid collection. No evidence of an intracranial mass. No midline shift. Vascular: No hyperdense vessel.  Atherosclerotic calcifications Skull: Normal. Negative for fracture or focal lesion. Sinuses/Orbits: Visualized orbits show no acute finding. Trace mucosal thickening within the bilateral ethmoid sinuses. ASPECTS  [facility] Stroke Program Early CT Score) - Ganglionic level infarction (caudate, lentiform nuclei, internal capsule, insula, M1-M3 cortex): 7 - Supraganglionic infarction (M4-M6 cortex): 3 Total score (0-10 with 10 being normal): 10 (when discounting chronic infarcts). These results were called by telephone at the time of interpretation on 10/10/2021 at 3:52 pm to provider [facility] , who verbally acknowledged these results. IMPRESSION: No evidence of acute intracranial abnormality. Redemonstrated chronic small-vessel infarcts within the right corona radiata, right basal ganglia and bilateral thalami. Background moderate cerebral white matter chronic small vessel ischemic disease. Mild generalized cerebral atrophy. Electronically Signed   By: Kellie Simmering D.O.   On: 10/10/2021 15:53     Subjective: Pt reports no recurrence of symptoms.  He is feeling much better.  He very much would like to go home.    Discharge Exam: Vitals:   10/11/21 0653 10/11/21 1030  BP: 118/65 131/72  Pulse: (!) 55 (!) 58  Resp: 18 16  Temp: 99 F (37.2 C) 98.7 F (37.1 C)  SpO2: 97% 98%   Vitals:   10/11/21 0300 10/11/21 0502 10/11/21 0653 10/11/21 1030  BP:  128/63 118/65 131/72  Pulse:  61 (!) 55 (!) 58  Resp:  17 18 16   Temp:  98.7 F (37.1 C) 99 F (37.2 C) 98.7 F (37.1 C)  TempSrc:  Oral  Oral  SpO2:  97% 97% 98%  Weight: 76 kg     Height: 5\' 10"  (1.778 m)       General: Pt is alert, awake, not in acute distress Cardiovascular: RRR, S1/S2 +, no rubs, no gallops Respiratory: CTA bilaterally, no wheezing, no rhonchi Abdominal: Soft, NT, ND, bowel sounds + Extremities: no edema, no cyanosis   The results of significant diagnostics from this hospitalization (including imaging, microbiology, ancillary and laboratory) are listed below for reference.     Microbiology: Recent Results (from the past 240 hour(s))  Resp Panel by RT-PCR (Flu A&B, Covid) Nasopharyngeal Swab     Status: None   Collection  Time: 10/10/21  4:06 PM   Specimen: Nasopharyngeal Swab; Nasopharyngeal(NP) swabs in vial transport medium  Result Value Ref Range Status   SARS Coronavirus 2 by RT PCR NEGATIVE NEGATIVE Final    Comment: (NOTE) SARS-CoV-2 target nucleic acids are NOT DETECTED.  The SARS-CoV-2 RNA is generally detectable in upper respiratory specimens during the acute phase of infection. The lowest concentration of SARS-CoV-2 viral copies this assay can detect is 138 copies/mL. A negative result does not preclude SARS-Cov-2 infection and should not be used as the sole basis for treatment or other patient management decisions. A negative result may occur with  improper specimen collection/handling, submission of specimen other than nasopharyngeal swab, presence of viral mutation(s) within the areas targeted by this assay, and inadequate number of viral copies(<138 copies/mL). A negative result must be combined with clinical observations, patient history, and epidemiological information. The expected result is Negative.  Fact Sheet for Patients:  EntrepreneurPulse.com.au  Fact Sheet for Healthcare Providers:  IncredibleEmployment.be  This test is no t yet approved or cleared by the [place] FDA and  has been authorized for detection and/or diagnosis of SARS-CoV-2 by  FDA under an Emergency Use Authorization (EUA). This EUA will remain  in effect (meaning this test can be used) for the duration of the COVID-19 declaration under Section 564(b)(1) of the Act, 21 U.S.C.section 360bbb-3(b)(1), unless the authorization is terminated  or revoked sooner.       Influenza A by PCR NEGATIVE NEGATIVE Final   Influenza B by PCR NEGATIVE NEGATIVE Final    Comment: (NOTE) The Xpert Xpress SARS-CoV-2/FLU/RSV plus assay is intended as an aid in the diagnosis of influenza from Nasopharyngeal swab specimens and should not be used as a sole basis for treatment. Nasal washings  and aspirates are unacceptable for Xpert Xpress SARS-CoV-2/FLU/RSV testing.  Fact Sheet for Patients: EntrepreneurPulse.com.au  Fact Sheet for Healthcare Providers: IncredibleEmployment.be  This test is not yet approved or cleared by the [place] FDA and has been authorized for detection and/or diagnosis of SARS-CoV-2 by FDA under an Emergency Use Authorization (EUA). This EUA will remain in effect (meaning this test can be used) for the duration of the COVID-19 declaration under Section 564(b)(1) of the Act, 21 U.S.C. section 360bbb-3(b)(1), unless the authorization is terminated or revoked.  Performed at [facility], [address], [place], [place] [address]      Labs: BNP (last 3 results) No results for input(s): BNP in the last 8760 hours. Basic Metabolic Panel: Recent Labs  Lab 10/10/21 1547  NA 133*  K 3.8  CL 104  CO2 20*  GLUCOSE 118*  BUN 21  CREATININE 1.65*  CALCIUM 8.4*   Liver Function Tests: Recent Labs  Lab 10/10/21 1547  AST 16  ALT 11  ALKPHOS 104  BILITOT 0.2*  PROT 6.8  ALBUMIN 3.5   No results for input(s): LIPASE, AMYLASE in the last 168 hours. No results for input(s): AMMONIA in the last 168 hours. CBC: Recent Labs  Lab 10/10/21 1547  WBC 7.0  NEUTROABS 6.0  HGB 10.8*  HCT 32.8*  MCV 90.9  PLT 196   Cardiac Enzymes: No results for input(s): CKTOTAL, CKMB, CKMBINDEX, TROPONINI in the last 168 hours. BNP: Invalid input(s): POCBNP CBG: Recent Labs  Lab 10/11/21 0236 10/11/21 0526 10/11/21 0651 10/11/21 0911 10/11/21 1102  GLUCAP 86 82 103* 134* 98   D-Dimer No results for input(s): DDIMER in the last 72 hours. Hgb A1c Recent Labs    10/11/21 0431  HGBA1C 5.6   Lipid Profile Recent Labs    10/11/21 0431  CHOL 110  HDL 21*  LDLCALC 73  TRIG 78  CHOLHDL 5.2   Thyroid function studies No results for input(s): TSH, T4TOTAL, T3FREE, THYROIDAB in the last 72  hours.  Invalid input(s): FREET3 Anemia work up No results for input(s): VITAMINB12, FOLATE, FERRITIN, TIBC, IRON, RETICCTPCT in the last 72 hours. Urinalysis    Component Value Date/Time   COLORURINE YELLOW 10/11/2021 0600   APPEARANCEUR CLEAR 10/11/2021 0600   LABSPEC 1.015 10/11/2021 0600   PHURINE 6.0 10/11/2021 0600   GLUCOSEU NEGATIVE 10/11/2021 0600   HGBUR NEGATIVE 10/11/2021 0600   BILIRUBINUR NEGATIVE 10/11/2021 0600   KETONESUR NEGATIVE 10/11/2021 0600   PROTEINUR NEGATIVE 10/11/2021 0600   NITRITE NEGATIVE 10/11/2021 0600   LEUKOCYTESUR NEGATIVE 10/11/2021 0600   Sepsis Labs Invalid input(s): PROCALCITONIN,  WBC,  LACTICIDVEN Microbiology Recent Results (from the past 240 hour(s))  Resp Panel by RT-PCR (Flu A&B, Covid) Nasopharyngeal Swab     Status: None   Collection Time: 10/10/21  4:06 PM   Specimen: Nasopharyngeal Swab; Nasopharyngeal(NP) swabs in vial transport medium  Result Value Ref Range Status   SARS Coronavirus 2 by RT PCR NEGATIVE NEGATIVE Final    Comment: (NOTE) SARS-CoV-2 target nucleic acids are NOT DETECTED.  The SARS-CoV-2 RNA is generally detectable in upper respiratory specimens during the acute phase of infection. The lowest concentration of SARS-CoV-2 viral copies this assay can detect is 138 copies/mL. A negative result does not preclude SARS-Cov-2 infection and should not be used as the sole basis for treatment or other patient management decisions. A negative result may occur with  improper specimen collection/handling, submission of specimen other than nasopharyngeal swab, presence of viral mutation(s) within the areas targeted by this assay, and inadequate number of viral copies(<138 copies/mL). A negative result must be combined with clinical observations, patient history, and epidemiological information. The expected result is Negative.  Fact Sheet for Patients:  EntrepreneurPulse.com.au  Fact Sheet for  Healthcare Providers:  IncredibleEmployment.be  This test is no t yet approved or cleared by the [place] FDA and  has been authorized for detection and/or diagnosis of SARS-CoV-2 by FDA under an Emergency Use Authorization (EUA). This EUA will remain  in effect (meaning this test can be used) for the duration of the COVID-19 declaration under Section 564(b)(1) of the Act, 21 U.S.C.section 360bbb-3(b)(1), unless the authorization is terminated  or revoked sooner.       Influenza A by PCR NEGATIVE NEGATIVE Final   Influenza B by PCR NEGATIVE NEGATIVE Final    Comment: (NOTE) The Xpert Xpress SARS-CoV-2/FLU/RSV plus assay is intended as an aid in the diagnosis of influenza from Nasopharyngeal swab specimens and should not be used as a sole basis for treatment. Nasal washings and aspirates are unacceptable for Xpert Xpress SARS-CoV-2/FLU/RSV testing.  Fact Sheet for Patients: EntrepreneurPulse.com.au  Fact Sheet for Healthcare Providers: IncredibleEmployment.be  This test is not yet approved or cleared by the [place] FDA and has been authorized for detection and/or diagnosis of SARS-CoV-2 by FDA under an Emergency Use Authorization (EUA). This EUA will remain in effect (meaning this test can be used) for the duration of the COVID-19 declaration under Section 564(b)(1) of the Act, 21 U.S.C. section 360bbb-3(b)(1), unless the authorization is terminated or revoked.  Performed at [facility], [address] [place], [place] [address]     Time coordinating discharge:   SIGNED:  Irwin Brakeman, MD  [facility] 10/11/2021, 12:51 PM How to contact the [facility] Attending or Consulting provider [place] or covering provider during after hours [place], for this patient?  Check the care team in [facility] and look for a) attending/consulting [facility] provider listed and b) the [facility] team listed Log into [email or web address] and use [facility]'s universal password to access. If you do not have the password, please contact the hospital operator. Locate the [facility] provider you are looking for under [facility] and page to a number that you can be directly reached. If you still have difficulty reaching the provider, please page the [facility] (Director on Call) for the Hospitalists listed on amion for assistance.

## 2021-10-11 NOTE — Progress Notes (Signed)
*  PRELIMINARY RESULTS* Echocardiogram 2D Echocardiogram has been performed.  Stacey Drain 10/11/2021, 9:54 AM

## 2021-10-11 NOTE — Discharge Instructions (Signed)
IMPORTANT INFORMATION: PAY CLOSE ATTENTION   PHYSICIAN DISCHARGE INSTRUCTIONS  Follow with Primary care provider  Richmond Campbell., PA-C  and other consultants as instructed by your Hospitalist Physician  SEEK MEDICAL CARE OR RETURN TO EMERGENCY ROOM IF SYMPTOMS COME BACK, WORSEN OR NEW PROBLEM DEVELOPS   Please note: You were cared for by a hospitalist during your hospital stay. Every effort will be made to forward records to your primary care provider.  You can request that your primary care provider send for your hospital records if they have not received them.  Once you are discharged, your primary care physician will handle any further medical issues. Please note that NO REFILLS for any discharge medications will be authorized once you are discharged, as it is imperative that you return to your primary care physician (or establish a relationship with a primary care physician if you do not have one) for your post hospital discharge needs so that they can reassess your need for medications and monitor your lab values.  Please get a complete blood count and chemistry panel checked by your Primary MD at your next visit, and again as instructed by your Primary MD.  Get Medicines reviewed and adjusted: Please take all your medications with you for your next visit with your Primary MD  Laboratory/radiological data: Please request your Primary MD to go over all hospital tests and procedure/radiological results at the follow up, please ask your primary care provider to get all Hospital records sent to his/her office.  In some cases, they will be blood work, cultures and biopsy results pending at the time of your discharge. Please request that your primary care provider follow up on these results.  If you are diabetic, please bring your blood sugar readings with you to your follow up appointment with primary care.    Please call and make your follow up appointments as soon as possible.    Also  Note the following: If you experience worsening of your admission symptoms, develop shortness of breath, life threatening emergency, suicidal or homicidal thoughts you must seek medical attention immediately by calling 911 or calling your MD immediately  if symptoms less severe.  You must read complete instructions/literature along with all the possible adverse reactions/side effects for all the Medicines you take and that have been prescribed to you. Take any new Medicines after you have completely understood and accpet all the possible adverse reactions/side effects.   Do not drive when taking Pain medications or sleeping medications (Benzodiazepines)  Do not take more than prescribed Pain, Sleep and Anxiety Medications. It is not advisable to combine anxiety,sleep and pain medications without talking with your primary care practitioner  Special Instructions: If you have smoked or chewed Tobacco  in the last 2 yrs please stop smoking, stop any regular Alcohol  and or any Recreational drug use.  Wear Seat belts while driving.  Do not drive if taking any narcotic, mind altering or controlled substances or recreational drugs or alcohol.

## 2021-10-11 NOTE — Care Management Obs Status (Signed)
MEDICARE OBSERVATION STATUS NOTIFICATION   Patient Details  Name: Steven GENOVA Sr. MRN: [id number] Date of Birth: [birth year]   Medicare Observation Status Notification Given:  Yes    Elliot Gault, LCSW 10/11/2021, 11:21 AM

## 2021-10-11 NOTE — Progress Notes (Signed)
°  Transition of Care [facility]) Screening Note   Patient Details  Name: Steven RIGOR Sr. Date of Birth: 10-Jul-1935   Transition of Care [facility]) CM/SW Contact:    Elliot Gault, LCSW Phone Number: 10/11/2021, 11:21 AM    Transition of Care Department [facility]) has reviewed patient and no TOC needs have been identified at this time. Will continue to monitor patient advancement through interdisciplinary progression rounds. If new patient transition needs arise, please place a TOC consult.

## 2021-10-11 NOTE — Plan of Care (Signed)
°  Problem: Education: Goal: Knowledge of General Education information will improve Description: Including pain rating scale, medication(s)/side effects and non-pharmacologic comfort measures Outcome: Progressing   Problem: Health Behavior/Discharge Planning: Goal: Ability to manage health-related needs will improve Outcome: Progressing   Problem: Clinical Measurements: Goal: Diagnostic test results will improve Outcome: Progressing   Problem: Education: Goal: Knowledge of disease or condition will improve Outcome: Progressing Goal: Knowledge of secondary prevention will improve (SELECT ALL) Outcome: Progressing Goal: Knowledge of patient specific risk factors will improve (INDIVIDUALIZE FOR PATIENT) Outcome: Progressing

## 2021-10-29 ENCOUNTER — Telehealth: Payer: Self-pay | Admitting: Interventional Cardiology

## 2021-10-29 NOTE — Telephone Encounter (Signed)
A user error has taken place: encounter opened in error, closed for administrative reasons.

## 2021-10-30 ENCOUNTER — Ambulatory Visit (INDEPENDENT_AMBULATORY_CARE_PROVIDER_SITE_OTHER): Payer: Medicare PPO

## 2021-10-30 DIAGNOSIS — I442 Atrioventricular block, complete: Secondary | ICD-10-CM | POA: Diagnosis not present

## 2021-10-30 LAB — CUP PACEART REMOTE DEVICE CHECK
Battery Remaining Longevity: 95 mo
Battery Remaining Percentage: 80 %
Battery Voltage: 3.04 V
Brady Statistic AP VP Percent: 14 %
Brady Statistic AP VS Percent: 1.1 %
Brady Statistic AS VP Percent: 80 %
Brady Statistic AS VS Percent: 4.2 %
Brady Statistic RA Percent Paced: 13 %
Brady Statistic RV Percent Paced: 94 %
Date Time Interrogation Session: 20230113101159
Implantable Lead Implant Date: 20201009
Implantable Lead Implant Date: 20201009
Implantable Lead Location: 753859
Implantable Lead Location: 753860
Implantable Pulse Generator Implant Date: 20201009
Lead Channel Impedance Value: 400 Ohm
Lead Channel Impedance Value: 510 Ohm
Lead Channel Pacing Threshold Amplitude: 0.625 V
Lead Channel Pacing Threshold Amplitude: 0.875 V
Lead Channel Pacing Threshold Pulse Width: 0.5 ms
Lead Channel Pacing Threshold Pulse Width: 0.5 ms
Lead Channel Sensing Intrinsic Amplitude: 1.1 mV
Lead Channel Sensing Intrinsic Amplitude: 11 mV
Lead Channel Setting Pacing Amplitude: 0.875
Lead Channel Setting Pacing Amplitude: 2 V
Lead Channel Setting Pacing Pulse Width: 0.5 ms
Lead Channel Setting Sensing Sensitivity: 4 mV
Pulse Gen Model: 2272
Pulse Gen Serial Number: 9166894

## 2021-11-10 NOTE — Progress Notes (Signed)
Remote pacemaker transmission.   

## 2021-11-24 NOTE — Progress Notes (Deleted)
Office Visit    Patient Name: Steven VOYTKO Sr. Date of Encounter: 11/24/2021  PCP:  Aletha Halim PA-C   [place]  Cardiologist:  Sinclair Grooms, MD  Advanced Practice Provider:  No care team member to display Electrophysiologist:  Will Meredith Leeds, MD   Chief Complaint    Steven Plan Sr. is a 87 y.o. male with a hx of HFrEF (echocardiogram done 07/2020 showed LVEF 35 to 40%), see Hb status post pacemaker, nonischemic cardiomyopathy, hypertension, hypothyroidism, CKD stage IIIb, and gout presents today for hospital follow-up.   He presented to the emergency department due to sudden onset of right arm weakness while helping out in the kitchen.  The patient also felt lightheaded and like he was going to pass out.  EMS was called and blood pressure was checked which was 90 systolic.  In route to the hospital he complained of numbness and tingling in his right fingers.  He denied chest pain, shortness of breath, slurred speech, fever, nausea, chills or vomiting.  CT of the head without contrast showed no evidence of acute intercranial abnormality.  Neurology was consulted and recommended further stroke work-up.  Recommendations from neurology included aspirin 325 mg daily.  LDL was greater than 70 so started on Crestor 5 mg 3 times weekly.  He cannot have an MRI due to pacemaker but the neurologist felt he really did not need to have it anyway.  Echocardiogram showed some improvement from prior testing.  Carotid Dopplers were reassuring.  He was discharged in stable condition.  He was last seen by our team in the previous month November 2022 by Dr. Lovena Le.  He denied chest pain or shortness of breath.  He was noted to have a paced QRS with a right bundle morphology.  He was asymptomatic from a pacemaker standpoint at this time.  Pacemaker was working normally.  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   Right bundle, left anterior hemiblock, and first degree AV block, June, 2015    Ventricular bigeminy 06/27/2014   Past Surgical History:  Procedure Laterality Date   appendectomy     COLONOSCOPY WITH PROPOFOL N/A 04/09/2016   Procedure: COLONOSCOPY WITH PROPOFOL;  Surgeon: Carol Ada, MD;  Location: [facility];  Service: Endoscopy;  Laterality: N/A;   PACEMAKER IMPLANT N/A 07/27/2019   Procedure: PACEMAKER IMPLANT;  Surgeon: Evans Lance, MD;  Location: [place] [facility];  Service: Cardiovascular;  Laterality: N/A;   TEMPORARY PACEMAKER N/A 07/26/2019   Procedure: TEMPORARY PACEMAKER;  Surgeon: Burnell Blanks, MD;  Location: [place] [facility];  Service: Cardiovascular;  Laterality: N/A;    Allergies  No Known Allergies    EKGs/Labs/Other Studies Reviewed:   The following studies were reviewed today:  Echocardiogram 10/11/2021 IMPRESSIONS     1. Left ventricular ejection fraction, by estimation, is 45 to 50%. The  left ventricle has mildly decreased function. The left  ventricle  demonstrates global hypokinesis, distal anteroseptal and apical wall  motion likely secondary to RV pacing. There is  moderate left ventricular hypertrophy. Left ventricular diastolic  parameters are consistent with Grade I diastolic dysfunction (impaired  relaxation).   2. Right ventricular systolic function is normal. The right ventricular  size is normal. Tricuspid regurgitation signal is inadequate for assessing  PA pressure.   3. Left atrial size was  severely dilated.   4. The mitral valve is grossly normal. Mild to moderate mitral valve  regurgitation.   5. The aortic valve is tricuspid. There is mild calcification of the  aortic valve. Aortic valve regurgitation is mild. Aortic valve  sclerosis/calcification is present, without any evidence of aortic  stenosis. Aortic regurgitation PHT measures 511 msec.   6. Unable to estimate CVP.   Comparison(s): Prior images reviewed side by side. LVEF is mildly reduced  at this point.   FINDINGS   Left Ventricle: Left ventricular ejection fraction, by estimation, is 45  to 50%. The left ventricle has mildly decreased function. The left  ventricle demonstrates global hypokinesis. The left ventricular internal  cavity size was normal in size. There is   moderate left ventricular hypertrophy. Abnormal (paradoxical) septal  motion, consistent with RV pacemaker. Left ventricular diastolic  parameters are consistent with Grade I diastolic dysfunction (impaired  relaxation).   EKG:  EKG is *** ordered today.  The ekg ordered today demonstrates ***  Recent Labs: 10/10/2021: ALT 11; BUN 21; Creatinine, Ser 1.65; Hemoglobin 10.8; Platelets 196; Potassium 3.8; Sodium 133  Recent Lipid Panel    Component Value Date/Time   CHOL 110 10/11/2021 0431   TRIG 78 10/11/2021 0431   HDL 21 (L) 10/11/2021 0431   CHOLHDL 5.2 10/11/2021 0431   VLDL 16 10/11/2021 0431   LDLCALC 73 10/11/2021 0431    Risk Assessment/Calculations:  {Does this patient have ATRIAL FIBRILLATION?[phone]}  Home Medications   No outpatient medications have been marked as taking for the 11/25/21 encounter (Appointment) with Steven Dubonnet, NP.     Review of Systems   ***   All other systems reviewed and are otherwise negative except as noted above.  Physical Exam    VS:  There were no vitals taken for this visit. , BMI There is no height or weight on file to calculate BMI.  Wt Readings from Last 3 Encounters:   10/11/21 167 lb 8.8 oz (76 kg)  09/15/21 173 lb 12.8 oz (78.8 kg)  07/23/21 180 lb (81.6 kg)     GEN: Well nourished, well developed, in no acute distress. HEENT: normal. Neck: Supple, no JVD, carotid bruits, or masses. Cardiac: ***RRR, no murmurs, rubs, or gallops. No clubbing, cyanosis, edema.  ***Radials/PT 2+ and equal bilaterally.  Respiratory:  ***Respirations regular and unlabored, clear to auscultation bilaterally. GI: Soft, nontender, nondistended. MS: No deformity or atrophy. Skin: Warm and dry, no rash. Neuro:  Strength and sensation are intact. Psych: Normal affect.  Assessment & Plan    HFrEF -Recent echocardiogram 10/11/2021 showed LVEF 45 to 50%, grade 1 DD, mild to moderate MR, mild AI.  CHB status post PPM  Nonischemic cardiomyopathy  Hypertension  Hyperlipidemia -LDL 73 -Started on Crestor 5 mg every other day while in the hospital -Continue to monitor annual lipid panel  Hypothyroidism  CKD stage IIIb      Disposition: Follow up {follow up:15908} with Sinclair Grooms, MD or APP.  Signed, Elgie Collard, PA-C 11/24/2021, 12:29 PM [facility]

## 2021-11-25 ENCOUNTER — Ambulatory Visit (HOSPITAL_BASED_OUTPATIENT_CLINIC_OR_DEPARTMENT_OTHER): Payer: Medicare PPO | Admitting: Physician Assistant

## 2021-11-25 DIAGNOSIS — E785 Hyperlipidemia, unspecified: Secondary | ICD-10-CM

## 2021-11-25 DIAGNOSIS — I5020 Unspecified systolic (congestive) heart failure: Secondary | ICD-10-CM

## 2021-11-25 DIAGNOSIS — N1832 Chronic kidney disease, stage 3b: Secondary | ICD-10-CM

## 2021-11-25 DIAGNOSIS — I5022 Chronic systolic (congestive) heart failure: Secondary | ICD-10-CM

## 2021-11-25 DIAGNOSIS — E039 Hypothyroidism, unspecified: Secondary | ICD-10-CM

## 2021-11-25 DIAGNOSIS — I10 Essential (primary) hypertension: Secondary | ICD-10-CM

## 2021-11-25 DIAGNOSIS — Z950 Presence of cardiac pacemaker: Secondary | ICD-10-CM

## 2021-12-12 NOTE — Progress Notes (Unsigned)
Cardiology Office Note:    Date:  12/12/2021   ID:  Steven Bailey Sr., DOB 16-Jan-1935, MRN [id number]  PCP:  Steven Krueger., PA-C   [facility] Providers Cardiologist:  Steven Noe, MD Electrophysiologist:  Steven Lemming, MD { Click to update primary MD,subspecialty MD or APP then REFRESH:1}    Referring MD: Steven Krueger., PA-C   No chief complaint on file. ***  History of Present Illness:    Steven Krueger. is a 87 y.o. male with a hx of NICM, chronic HFrEF, complete heart block s/p PPM insert, HTN, CKD Stage 3, gout, hypothyroidism, and arthritis.   He had been followed by EP for trifascicular  block for a few years. He had St. Jude PPM implanted 07/27/2019 following hospitalization for transient loss of consciousness. He went to see his PCP and was found to be in complete heart block and was transferred to the ED. While in the ED his heart rate decreased to 12 bpm.   He saw primary cardiologist, Dr. Katrinka Krueger, on 07/02/20 who had concerns about decreased LV function due to predominant RV pacing. His QRS duration was very long on EKG, Echo on 07/29/20 LVEF had decreased to 35-40%, LV moderately decreased funciton, global hypokinesis, moderate concentric ventricular hypertrophy, normal RV, trivial MR, moderate AI. Dr. Katrinka Krueger recommended appointment with EP to consider CRT upgrade. Plan for close follow-up of LVEF as patient was clinically stable. He was last seen in our office by [name] on 09/15/21.  On 10/10/21, he was taken to [facility] ED for arm numbness and near syncope where he was found to be hypotensive with SBP in the 90s and CBG was 66. CT of head showed no evidence of acute intracranial abnormality. Cardiac work up was unremarkable. Echo revealed LVEF had improved ot 45-50%, G1 DD.   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   Right bundle, left anterior hemiblock, and first degree AV block, June, 2015    Ventricular bigeminy 06/27/2014    Past Surgical History:  Procedure Laterality Date   appendectomy     COLONOSCOPY WITH PROPOFOL N/A 04/09/2016   Procedure: COLONOSCOPY WITH PROPOFOL;  Surgeon: [name], MD;  Location: [facility];  Service: Endoscopy;  Laterality: N/A;   PACEMAKER IMPLANT N/A 07/27/2019   Procedure: PACEMAKER IMPLANT;  Surgeon: Marinus Maw, MD;  Location: [facility];  Service: Cardiovascular;  Laterality: N/A;   TEMPORARY PACEMAKER N/A 07/26/2019   Procedure: TEMPORARY PACEMAKER;  Surgeon: Kathleene Hazel, MD;  Location: [facility];  Service: Cardiovascular;  Laterality: N/A;    Current Medications: No outpatient medications have been marked as taking for the 12/14/21 encounter (Appointment) with Dyann Kief, PA-C.     Allergies:   Patient has no known allergies.   Social History   Socioeconomic History   Marital status: Married    Spouse name: Not on file   Number of children: 2   Years of education:  Not on file   Highest education level: Not on file  Occupational History   Occupation: retired  Tobacco Use   Smoking status: Former   Smokeless tobacco: Current    Types: Nurse, children's Use: Never used  Substance and Sexual Activity   Alcohol use: No   Drug use: No   Sexual activity: Not on file  Other Topics Concern   Not on file  Social History Narrative   Not on file   Social Determinants of Health   Financial Resource Strain: Not on file  Food Insecurity: Not on file  Transportation Needs: Not on  file  Physical Activity: Not on file  Stress: Not on file  Social Connections: Not on file     Family History: The patient's ***family history includes CAD in his mother; Cancer in his father and another family member; Heart disease in an other family member; Hypertension in an other family member.  ROS:   Please see the history of present illness.    *** All other systems reviewed and are negative.  Labs/Other Studies Reviewed:    The following studies were reviewed today:  Echo 10/11/21  Left Ventricle: Left ventricular ejection fraction, by estimation, is 45  to 50%. The left ventricle has mildly decreased function. The left  ventricle demonstrates global hypokinesis. The left ventricular internal  cavity size was normal in size. There is  moderate left ventricular hypertrophy. Abnormal (paradoxical) septal  motion, consistent with RV pacemaker. Left ventricular diastolic  parameters are consistent with Grade I diastolic dysfunction (impaired  relaxation).  Right Ventricle: The right ventricular size is normal. No increase in  right ventricular wall thickness. Right ventricular systolic function is  normal. Tricuspid regurgitation signal is inadequate for assessing PA  pressure.  Left Atrium: Left atrial size was severely dilated.  Right Atrium: Right atrial size was normal in size.  Pericardium: There is no evidence of pericardial effusion.  Mitral Valve: The mitral valve is grossly normal. Mild to moderate mitral  valve regurgitation.  Tricuspid Valve: The tricuspid valve is grossly normal. Tricuspid valve  regurgitation is trivial.  Aortic Valve: The aortic valve is tricuspid. There is mild calcification  of the aortic valve. There is moderate aortic valve annular calcification.  Aortic valve regurgitation is mild. Aortic regurgitation PHT measures 511  msec. Aortic valve  sclerosis/calcification is present, without any evidence of aortic  stenosis.  Pulmonic Valve:  The pulmonic valve was not well visualized. Pulmonic valve  regurgitation is trivial.  Aorta: The aortic root is normal in size and structure.  Venous: Unable to estimate CVP. The inferior vena cava was not well  visualized.  IAS/Shunts: No atrial level shunt detected by color flow Doppler.     Echo 07/2020  Left Ventricle: Significant dyssynchrony seen, even more prominent than  last echo. Global hypokinesis, most prominent at apex. LV thrombus  excluded by contrast. Left ventricular ejection fraction, by estimation,  is 35 to 40%. The left ventricle has  moderately decreased function. The left ventricle demonstrates global  hypokinesis. Definity contrast agent was given IV to delineate the left  ventricular endocardial borders. The left ventricular internal cavity size  was normal in size. There is moderate   concentric left ventricular hypertrophy. Abnormal (paradoxical) septal  motion, consistent with RV pacemaker. Left ventricular diastolic  parameters are indeterminate.  Right Ventricle: The right ventricular size is normal. Right vetricular  wall thickness was not well visualized. Right ventricular systolic  function  is normal. There is normal pulmonary artery systolic pressure.  The tricuspid regurgitant velocity is 2.08  m/s, and with an assumed right atrial pressure of 8 mmHg, the estimated  right ventricular systolic pressure is 99991111 mmHg.  Left Atrium: Left atrial size was mildly dilated.  Right Atrium: Right atrial size was normal in size.  Pericardium: The pericardium was not well visualized.  Mitral Valve: The mitral valve is grossly normal. Trivial mitral valve  regurgitation.  Tricuspid Valve: The tricuspid valve is grossly normal. Tricuspid valve  regurgitation is mild.  Aortic Valve: The aortic valve is tricuspid. There is mild calcification  of the aortic valve. There is mild thickening of the aortic valve. Aortic  valve regurgitation is moderate. Aortic  regurgitation PHT measures 727  msec. Mild aortic valve sclerosis is  present, with no evidence of aortic valve stenosis.  Pulmonic Valve: The pulmonic valve was grossly normal. Pulmonic valve  regurgitation is not visualized.  Aorta: The aortic root, ascending aorta and aortic arch are all  structurally normal, with no evidence of dilitation or obstruction.  Venous: The inferior vena cava was not well visualized.  IAS/Shunts: The atrial septum is grossly normal.   Recent Labs: 10/10/2021: ALT 11; BUN 21; Creatinine, Ser 1.65; Hemoglobin 10.8; Platelets 196; Potassium 3.8; Sodium 133  Recent Lipid Panel    Component Value Date/Time   CHOL 110 10/11/2021 0431   TRIG 78 10/11/2021 0431   HDL 21 (L) 10/11/2021 0431   CHOLHDL 5.2 10/11/2021 0431   VLDL 16 10/11/2021 0431   LDLCALC 73 10/11/2021 0431     Risk Assessment/Calculations:   {Does this patient have ATRIAL FIBRILLATION?:[phone]}       Physical Exam:    VS:  There were no vitals taken for this visit.    Wt Readings from Last 3 Encounters:  10/11/21 167 lb 8.8 oz (76 kg)  09/15/21 173 lb 12.8 oz (78.8 kg)  07/23/21 180 lb (81.6 kg)     GEN: *** Well nourished, well developed in no acute distress HEENT: Normal NECK: No JVD; No carotid bruits CARDIAC: ***RRR, no murmurs, rubs, gallops RESPIRATORY:  Clear to auscultation without rales, wheezing or rhonchi  ABDOMEN: Soft, non-tender, non-distended MUSCULOSKELETAL:  No edema; No deformity. *** pedal pulses, ***bilaterally SKIN: Warm and dry NEUROLOGIC:  Alert and oriented x 3 PSYCHIATRIC:  Normal affect   EKG:  EKG is *** ordered today.  The ekg ordered today demonstrates ***  Diagnoses:    No diagnosis found. Assessment and Plan:     ***          {Are you ordering a CV Procedure (e.g. stress test, cath, DCCV, TEE, etc)?   Press F2        :[id number]    Medication Adjustments/Labs and Tests Ordered: Current medicines are reviewed at length with the  patient today.  Concerns regarding medicines are outlined above.  No orders of the defined types were placed in this encounter.  No orders of the defined types were placed in this encounter.   There are no Patient Instructions on file for this visit.   Signed, Emmaline Life, NP  12/12/2021 4:23 PM    [facility]

## 2021-12-14 ENCOUNTER — Ambulatory Visit: Payer: Medicare PPO | Admitting: Nurse Practitioner

## 2022-01-07 ENCOUNTER — Other Ambulatory Visit: Payer: Self-pay

## 2022-01-07 ENCOUNTER — Ambulatory Visit: Payer: Medicare PPO | Admitting: Physician Assistant

## 2022-01-07 ENCOUNTER — Encounter: Payer: Self-pay | Admitting: Physician Assistant

## 2022-01-07 VITALS — BP 138/78 | HR 64 | Resp 16 | Ht 70.0 in | Wt 173.0 lb

## 2022-01-07 DIAGNOSIS — I10 Essential (primary) hypertension: Secondary | ICD-10-CM

## 2022-01-07 DIAGNOSIS — I442 Atrioventricular block, complete: Secondary | ICD-10-CM | POA: Diagnosis not present

## 2022-01-07 DIAGNOSIS — E782 Mixed hyperlipidemia: Secondary | ICD-10-CM

## 2022-01-07 DIAGNOSIS — I5022 Chronic systolic (congestive) heart failure: Secondary | ICD-10-CM | POA: Diagnosis not present

## 2022-01-07 NOTE — Progress Notes (Signed)
?Cardiology Office Note:   ? ?Date:  01/07/2022  ? ?ID:  [name], DOB [birth year], MRN [id number] ? ?PCP:  Aletha Halim., PA-C  ?[facility] Cardiologist:  Sinclair Grooms, MD  ?[facility] Electrophysiologist:  Cristopher Peru, MD  ? ?Chief Complaint: follow up ? ?History of Present Illness:   ? ?Steven RADDE Sr. is a 87 y.o. male with a hx of CHB s/p PPM insertion, HTN, PVC's, chronic systolic CHF, CKD III, chronic anemia  and remote stroke presents for follow up.  ? ? Echocardiogram March 2018 showed normal LV function, mild aortic insufficiency and mildly dilated aortic root.   ?Admitted 07/2019 for CHB  s/p  St Jude dual chamber pacemaker. ? ?Echo 07/2019 with LVEF of 35-40% ?Echo 07/2020 with LVEF 35-40%. 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 Seen by EP and did not felt need of CRT upgrade per note 09/2020. ?Echo 09/2021 with LVEF of 45-50% and grade 1 DD.  ? ?Here today for follow up.  No complaints.  He walks with walker.  He has bad knee. Denies chest pain, shortness of breath, palpitation, dizziness, orthopnea, PND, syncope or lower extremity edema.  Compliant with his medications. ? ?Past Medical History:  ?Diagnosis Date  ? Chronic kidney disease, stage 3 ([place]) 04/01/2014  ? Complete heart block ([place]) 07/26/2019  ? temp wire placed  ? CVA, old, hemiparesis ([place]) 04/01/2014  ? Essential hypertension 04/01/2014  ? Hyperlipidemia   ? Hypothyroidism   ? takes Synthroid daily  ? Kidney stones   ? Osteoarthritis of right knee 04/01/2014  ? Pacemaker 07/27/2019  ? St Jude dual chamber  ? Pseudo-bradycardia 06/27/2014  ? Ventricular bigeminy  ? 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 Right bundle, left anterior hemiblock, and first degree AV block, June, 2015   ? Ventricular bigeminy 06/27/2014  ? ? ?Past Surgical History:  ?Procedure Laterality Date  ? appendectomy    ? COLONOSCOPY WITH PROPOFOL N/A 04/09/2016  ? Procedure: COLONOSCOPY WITH PROPOFOL;  Surgeon: Carol Ada, MD;  Location: [facility];  Service:  Endoscopy;  Laterality: N/A;  ? PACEMAKER IMPLANT N/A 07/27/2019  ? Procedure: PACEMAKER IMPLANT;  Surgeon: Evans Lance, MD;  Location: [place] [facility];  Service: Cardiovascular;  Laterality: N/A;  ? TEMPORARY PACEMAKER N/A 07/26/2019  ? Procedure: TEMPORARY PACEMAKER;  Surgeon: Burnell Blanks, MD;  Location: [place] [facility];  Service: Cardiovascular;  Laterality: N/A;  ? ? ?Current Medications: ?Current Meds  ?Medication Sig  ? allopurinol (ZYLOPRIM) 300 MG tablet TAKE ONE TABLET BY MOUTH EVERY DAY (Patient taking differently: Take 300 mg by mouth daily.)  ? aspirin EC 325 MG EC tablet Take 1 tablet (325 mg total) by mouth daily.  ? carvedilol (COREG) 6.25 MG tablet Take 1 tablet (6.25 mg total) by mouth 2 (two) times daily.  ? Cholecalciferol (VITAMIN D3) 10 MCG (400 UNIT) CHEW Chew 10 mcg by mouth daily.  ? levothyroxine (SYNTHROID, LEVOTHROID) 25 MCG tablet Take 25 mcg by mouth daily.  ? losartan-hydrochlorothiazide (HYZAAR) 50-12.5 MG tablet TAKE ONE TABLET BY MOUTH DAILY (Patient taking differently: Take 1 tablet by mouth daily.)  ? omeprazole (PRILOSEC) 40 MG capsule Take 1 capsule (40 mg total) by mouth daily.  ? rosuvastatin (CRESTOR) 5 MG tablet Take 1 tablet (5 mg total) by mouth 3 (three) times a week.  ?  ? ?Allergies:   Patient has no known allergies.  ? ?Social History  ? ?Socioeconomic History  ? Marital status: Married  ?  Spouse name: Not on file  ? Number of children: 2  ? Years of education: Not on file  ? Highest education level: Not on file  ?Occupational History  ? Occupation: retired  ?Tobacco Use  ? Smoking status: Former  ? Smokeless tobacco: Current  ?  Types: Chew  ?Vaping Use  ? Vaping Use: Never used  ?Substance and Sexual Activity  ? Alcohol use: No  ? Drug use: No  ? Sexual activity: Not on file  ?Other Topics Concern  ? Not on file  ?Social History Narrative  ? Not on file  ? ?Social Determinants of Health  ? ?Financial Resource Strain: Not on file  ?Food Insecurity:  Not on file  ?Transportation Needs: Not on file  ?Physical Activity: Not on file  ?Stress: Not on file  ?Social Connections: Not on file  ?  ? ?Family History: ?The patient's family history includes CAD in his mother; Cancer in his father and another family member; Heart disease in an other family member; Hypertension in an other family member.   ? ?ROS:   ?Please see the history of present illness.    ?All other systems reviewed and are negative.  ? ?EKGs/Labs/Other Studies Reviewed:   ? ?The following studies were reviewed today: ? ?Echo 09/2021 ?1. Left ventricular ejection fraction, by estimation, is 45 to 50%. The  ?left ventricle has mildly decreased function. The left ventricle  ?demonstrates global hypokinesis, distal anteroseptal and apical wall  ?motion likely secondary to RV pacing. There is  ?moderate left ventricular hypertrophy. Left ventricular diastolic  ?parameters are consistent with Grade I diastolic dysfunction (impaired  ?relaxation).  ? 2. Right ventricular systolic function is normal. The right ventricular  ?size is normal. Tricuspid regurgitation signal is inadequate for assessing  ?PA pressure.  ? 3. Left atrial size was severely dilated.  ? 4. The mitral valve is grossly normal. Mild to moderate mitral valve  ?regurgitation.  ? 5. The aortic valve is tricuspid. There is mild calcification of the  ?aortic valve. Aortic valve regurgitation is mild. Aortic valve  ?sclerosis/calcification is present, without any evidence of aortic  ?stenosis. Aortic regurgitation PHT measures 511 msec.  ? 6. Unable to estimate CVP.  ? ?EKG:  EKG is not  ordered today.  ? ?Recent Labs: ?10/10/2021: ALT 11; BUN 21; Creatinine, Ser 1.65; Hemoglobin 10.8; Platelets 196; Potassium 3.8; Sodium 133  ?Recent Lipid Panel ?   ?Component Value Date/Time  ? CHOL 110 10/11/2021 0431  ? TRIG 78 10/11/2021 0431  ? HDL 21 (L) 10/11/2021 0431  ? CHOLHDL 5.2 10/11/2021 0431  ? VLDL 16 10/11/2021 0431  ? [place] 73 10/11/2021  0431  ? ? ?Physical Exam:   ? ?VS:  BP 138/78   Pulse 64   Resp 16   Ht 5\' 10"  (1.778 m)   Wt 173 lb (78.5 kg)   BMI 24.82 kg/m?    ? ?Wt Readings from Last 3 Encounters:  ?01/07/22 173 lb (78.5 kg)  ?10/11/21 167 lb 8.8 oz (76 kg)  ?09/15/21 173 lb 12.8 oz (78.8 kg)  ?  ? ?GEN:  Well nourished, well developed in no acute distress ?HEENT: Normal ?NECK: No JVD; No carotid bruits ?LYMPHATICS: No lymphadenopathy ?CARDIAC: RRR, no murmurs, rubs, gallops ?RESPIRATORY:  Clear to auscultation without rales, wheezing or rhonchi  ?ABDOMEN: Soft, non-tender, non-distended ?MUSCULOSKELETAL:  No edema; No deformity  ?SKIN: Warm and dry ?NEUROLOGIC:  Alert and oriented x 3 ?PSYCHIATRIC:  Normal affect  ? ?ASSESSMENT  AND PLAN:  ? ? ?HTN ?- BP stable on current medications. No change.  ? ?2. CHB s/p PPM ?- Followed by EP ? ?3. HLD ?- 10/11/2021: Cholesterol 110; HDL 21; LDL Cholesterol 73; Triglycerides 78; VLDL 16  ?- Continue statin  ?- Followed by PCP ? ?4. CKD III ?- Scr was 1.65 on 09/2021 ? ?Medication Adjustments/Labs and Tests Ordered: ?Current medicines are reviewed at length with the patient today.  Concerns regarding medicines are outlined above.  ?No orders of the defined types were placed in this encounter. ? ?No orders of the defined types were placed in this encounter. ? ? ?There are no Patient Instructions on file for this visit.  ? ?Signed, ?Leanor Kail, [place]  ?01/07/2022 3:19 PM    ?[place] ?

## 2022-01-07 NOTE — Patient Instructions (Signed)
Medication Instructions:  ?Your physician recommends that you continue on your current medications as directed. Please refer to the Current Medication list given to you today. ? ?*If you need a refill on your cardiac medications before your next appointment, please call your pharmacy* ? ? ?Lab Work: ?None ordered ?If you have labs (blood work) drawn today and your tests are completely normal, you will receive your results only by: ?MyChart Message (if you have MyChart) OR ?A paper copy in the mail ?If you have any lab test that is abnormal or we need to change your treatment, we will call you to review the results. ? ? ?Testing/Procedures: ?None Ordered ? ? ?Follow-Up: ?At [facility], you and your health needs are our priority.  As part of our continuing mission to provide you with exceptional heart care, we have created designated Provider Care Teams.  These Care Teams include your primary Cardiologist (physician) and Advanced Practice Providers (APPs -  Physician Assistants and Nurse Practitioners) who all work together to provide you with the care you need, when you need it. ? ?We recommend signing up for the patient portal called "MyChart".  Sign up information is provided on this After Visit Summary.  MyChart is used to connect with patients for Virtual Visits (Telemedicine).  Patients are able to view lab/test results, encounter notes, upcoming appointments, etc.  Non-urgent messages can be sent to your provider as well.   ?To learn more about what you can do with MyChart, go to [email or web address].   ? ?Your next appointment:   ?12 month(s) ? ?The format for your next appointment:   ?In Person ? ?Provider:   ?Lesleigh Noe, MD  ? ? ? ? ?Other Instructions ?  ?

## 2022-01-29 ENCOUNTER — Ambulatory Visit (INDEPENDENT_AMBULATORY_CARE_PROVIDER_SITE_OTHER): Payer: Medicare PPO

## 2022-01-29 DIAGNOSIS — I442 Atrioventricular block, complete: Secondary | ICD-10-CM | POA: Diagnosis not present

## 2022-02-01 LAB — CUP PACEART REMOTE DEVICE CHECK
Battery Remaining Longevity: 92 mo
Battery Remaining Percentage: 78 %
Battery Voltage: 3.04 V
Brady Statistic AP VP Percent: 29 %
Brady Statistic AP VS Percent: 2 %
Brady Statistic AS VP Percent: 63 %
Brady Statistic AS VS Percent: 3.8 %
Brady Statistic RA Percent Paced: 29 %
Brady Statistic RV Percent Paced: 92 %
Date Time Interrogation Session: 20230414155552
Implantable Lead Implant Date: 20201009
Implantable Lead Implant Date: 20201009
Implantable Lead Location: 753859
Implantable Lead Location: 753860
Implantable Pulse Generator Implant Date: 20201009
Lead Channel Impedance Value: 410 Ohm
Lead Channel Impedance Value: 580 Ohm
Lead Channel Pacing Threshold Amplitude: 0.5 V
Lead Channel Pacing Threshold Amplitude: 1.75 V
Lead Channel Pacing Threshold Pulse Width: 0.5 ms
Lead Channel Pacing Threshold Pulse Width: 0.5 ms
Lead Channel Sensing Intrinsic Amplitude: 1 mV
Lead Channel Sensing Intrinsic Amplitude: 12 mV
Lead Channel Setting Pacing Amplitude: 0.75 V
Lead Channel Setting Pacing Amplitude: 2 V
Lead Channel Setting Pacing Pulse Width: 0.5 ms
Lead Channel Setting Sensing Sensitivity: 4 mV
Pulse Gen Model: 2272
Pulse Gen Serial Number: 9166894

## 2022-02-16 NOTE — Progress Notes (Signed)
Remote pacemaker transmission.   

## 2022-03-05 ENCOUNTER — Telehealth: Payer: Self-pay

## 2022-03-05 MED ORDER — APIXABAN 2.5 MG PO TABS: 2.5000 mg | ORAL_TABLET | Freq: Two times a day (BID) | ORAL | 3 refills | Status: DC

## 2022-03-05 NOTE — Telephone Encounter (Signed)
AF confirmed on remote transmission by [name]. Verbal order obtained from [name] to start Eliquis 2.5mg  BID and STOP ASA 325mg . Patient called and made aware. Voiced understanding. Sent script to pharmacy.

## 2022-03-05 NOTE — Telephone Encounter (Signed)
-----   Message from Dorathy Daft, RN sent at 02/25/2022  8:57 AM EDT ----- Regarding: AF/ ? [facility] Hey can someone review the alert I received for AF on this patient on 510/23 with GT today?   Looks like he had up to 5hrs of AF on 02/24/22 his CHADSVasc is at least 5. He may of have had more AF by the time GT is in the office.   Thanks!

## 2022-03-19 ENCOUNTER — Telehealth: Payer: Self-pay | Admitting: Interventional Cardiology

## 2022-03-19 NOTE — Telephone Encounter (Signed)
Spoke with patient's son who reports patient has had pedal edema that began a week ago. Patient also reports soreness at hip/groin area where pacemaker is located. Son unable to tell me if there is any irritation or skin breakdown to pacemaker area, states patient just complains of soreness to the area.  Per son, patient denies SOB. Patient does not weigh himself daily, unsure if he has gained weight but state the swelling only appears to be in his feet.  Son requested to be seen when he is able to get patient in, scheduled with DOD Dr. Elberta Fortis on 03/26/22 at 3:45PM.

## 2022-03-19 NOTE — Telephone Encounter (Signed)
Pt c/o swelling: STAT is pt has developed SOB within 24 hours  If swelling, where is the swelling located? LE Edema  How much weight have you gained and in what time span? No weight gain  Have you gained 3 pounds in a day or 5 pounds in a week? No   Do you have a log of your daily weights (if so, list)? No   Are you currently taking a fluid pill? No   Are you currently SOB? No   Have you traveled recently? No

## 2022-03-26 ENCOUNTER — Ambulatory Visit (INDEPENDENT_AMBULATORY_CARE_PROVIDER_SITE_OTHER): Payer: Medicare PPO | Admitting: Cardiology

## 2022-03-26 ENCOUNTER — Encounter: Payer: Self-pay | Admitting: Cardiology

## 2022-03-26 VITALS — BP 112/50 | HR 74 | Ht 70.0 in | Wt 165.6 lb

## 2022-03-26 DIAGNOSIS — I493 Ventricular premature depolarization: Secondary | ICD-10-CM | POA: Diagnosis not present

## 2022-03-26 DIAGNOSIS — I442 Atrioventricular block, complete: Secondary | ICD-10-CM | POA: Diagnosis not present

## 2022-03-26 NOTE — Progress Notes (Signed)
Electrophysiology Office Note   Date:  03/26/2022   ID:  Steven Cary., DOB [birth year], MRN [id number]  PCP:  Aletha Halim., PA-C  Cardiologist:  Tamala Julian Primary Electrophysiologist:  Gretna Bergin Meredith Leeds, MD    No chief complaint on file.    History of Present Illness: Steven Bachus. is a 87 y.o. male who presents today for electrophysiology evaluation.   Steven Klaphake. is a 87 y.o. male who is being seen today for the evaluation of PVCs and bradycardia at the request of Deatra Ina, Kristen W., PA-C   He has a history of complete heart block status post Abbott dual-chamber pacemaker, hypertension, PVCs, systolic heart failure, CKD stage III.  Echo shows an ejection fraction of 45 to 50%.  Today, denies symptoms of palpitations, chest pain, shortness of breath, orthopnea, PND, claudication, dizziness, presyncope, syncope, bleeding, or neurologic sequela. The patient is tolerating medications without difficulties.  He complains of lower extremity edema around his ankles.  He states that he happens on a daily basis.  He has no chest pain or shortness of breath.  He otherwise feels well other than hip pain.  He does have bad arthritis in his knee and feels that his hip pain is related to that.   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   Right bundle, left anterior hemiblock, and first degree AV block, June, 2015    Ventricular bigeminy 06/27/2014   Past Surgical History:  Procedure Laterality Date   appendectomy     COLONOSCOPY WITH PROPOFOL N/A  04/09/2016   Procedure: COLONOSCOPY WITH PROPOFOL;  Surgeon: Carol Ada, MD;  [facility]: [facility];  Service: Endoscopy;  Laterality: N/A;   PACEMAKER IMPLANT N/A 07/27/2019   Procedure: PACEMAKER IMPLANT;  Surgeon: Evans Lance, MD;  Location: [place] [facility];  Service: Cardiovascular;  Laterality: N/A;   TEMPORARY PACEMAKER N/A 07/26/2019   Procedure: TEMPORARY PACEMAKER;  Surgeon: Burnell Blanks, MD;  Location: [place] [facility];  Service: Cardiovascular;  Laterality: N/A;     Current Outpatient Medications  Medication Sig Dispense Refill   allopurinol (ZYLOPRIM) 300 MG tablet TAKE ONE TABLET BY MOUTH EVERY DAY (Patient taking differently: Take 300 mg by mouth daily.) 30 tablet 5   apixaban (ELIQUIS) 2.5 MG TABS tablet Take 1 tablet (2.5 mg total) by mouth 2 (two) times daily. 60 tablet 3   carvedilol (COREG) 6.25 MG tablet Take 1 tablet (6.25 mg total) by mouth 2 (two) times daily. 180 tablet 2   Cholecalciferol (VITAMIN D3) 10 MCG (400 UNIT) CHEW Chew 10 mcg by mouth daily.     levothyroxine (SYNTHROID, LEVOTHROID) 25 MCG tablet Take 25 mcg by mouth daily.     losartan-hydrochlorothiazide (HYZAAR) 50-12.5 MG tablet TAKE ONE TABLET BY MOUTH DAILY (Patient taking differently: Take 1 tablet by mouth daily.) 90 tablet 3   omeprazole (PRILOSEC) 40 MG capsule Take 1 capsule (40 mg total) by mouth daily. 30 capsule 1   rosuvastatin (CRESTOR) 5 MG tablet Take 1 tablet (  5 mg total) by mouth 3 (three) times a week. 12 tablet 1   No current facility-administered medications for this visit.    Allergies:   Patient has no known allergies.   Social History:  The patient  reports that he has quit smoking. His smokeless tobacco use includes chew. He reports that he does not drink alcohol and does not use drugs.   Family History:  The patient's family history includes CAD in his mother; Cancer in his father and another family member; Heart disease in an other family member;  Hypertension in an other family member.   ROS:  Please see the history of present illness.   Otherwise, review of systems is positive for none.   All other systems are reviewed and negative.   PHYSICAL EXAM: VS:  BP (!) 112/50   Pulse 74   Ht 5\' 10"  (1.778 m)   Wt 165 lb 9.6 oz (75.1 kg)   SpO2 97%   BMI 23.76 kg/m  , BMI Body mass index is 23.76 kg/m. GEN: Well nourished, well developed, in no acute distress  HEENT: normal  Neck: no JVD, carotid bruits, or masses Cardiac: RRR; no murmurs, rubs, or gallops, 1+ edema  Respiratory:  clear to auscultation bilaterally, normal work of breathing GI: soft, nontender, nondistended, + BS MS: no deformity or atrophy  Skin: warm and dry, device site well healed Neuro:  Strength and sensation are intact Psych: euthymic mood, full affect  EKG:  EKG is ordered today. Personal review of the ekg ordered shows this rhythm, ventricular paced, PVCs  Personal review of the device interrogation today. Results in [place]: 10/10/2021: ALT 11; BUN 21; Creatinine, Ser 1.65; Hemoglobin 10.8; Platelets 196; Potassium 3.8; Sodium 133    Lipid Panel     Component Value Date/Time   CHOL 110 10/11/2021 0431   TRIG 78 10/11/2021 0431   HDL 21 (L) 10/11/2021 0431   CHOLHDL 5.2 10/11/2021 0431   VLDL 16 10/11/2021 0431   LDLCALC 73 10/11/2021 0431     Wt Readings from Last 3 Encounters:  03/26/22 165 lb 9.6 oz (75.1 kg)  01/07/22 173 lb (78.5 kg)  10/11/21 167 lb 8.8 oz (76 kg)      Other studies Reviewed: Additional studies/ records that were reviewed today include: TTE 10/11/21 Review of the above records today demonstrates:   1. Left ventricular ejection fraction, by estimation, is 45 to 50%. The  left ventricle has mildly decreased function. The left ventricle  demonstrates global hypokinesis, distal anteroseptal and apical wall  motion likely secondary to RV pacing. There is  moderate left ventricular hypertrophy. Left  ventricular diastolic  parameters are consistent with Grade I diastolic dysfunction (impaired  relaxation).   2. Right ventricular systolic function is normal. The right ventricular  size is normal. Tricuspid regurgitation signal is inadequate for assessing  PA pressure.   3. Left atrial size was severely dilated.   4. The mitral valve is grossly normal. Mild to moderate mitral valve  regurgitation.   5. The aortic valve is tricuspid. There is mild calcification of the  aortic valve. Aortic valve regurgitation is mild. Aortic valve  sclerosis/calcification is present, without any evidence of aortic  stenosis. Aortic regurgitation PHT measures 511 msec.   6. Unable to estimate CVP.   ASSESSMENT AND PLAN:  1.  Complete heart block: Status post Saint Jude dual-chamber pacemaker implanted 07/27/2019.  Device functioning appropriately.  No changes at this time.  2.  Hypertension: Currently well controlled  3.  PVCs: 13% found on cardiac monitor.  Minimally symptomatic.  Echo with stable ejection fraction.  No changes.  4.  Lower extremity edema: Patient eats a diet high in salt.  I have told him to avoid processed and canned foods.  Current medicines are reviewed at length with the patient today.   The patient does not have concerns regarding his medicines.  The following changes were made today: none  Labs/ tests ordered today include:  Orders Placed This Encounter  Procedures   EKG 12-Lead     Disposition:   FU 12 months  Signed, Nthony Lefferts Meredith Leeds, MD  03/26/2022 4:03 PM     [place] [address] [place] [id number] [phone] (office) [phone] (fax)

## 2022-04-21 ENCOUNTER — Telehealth: Payer: Self-pay

## 2022-04-21 NOTE — Telephone Encounter (Signed)
[facility] alert received. Exceeded AT/AF Burden.  Ventricular AutoCapture pacing at high output mode (5.0V).  A. Sensitivity Safety Margin < 2:1.  V. Sensitivity Safety Margin < 2:1.  Ventricular auto threshold 3.75V @ 0.70ms.  Programmed @ 5.0V @ 0.27ms.  Trends indicate increased frequency of out of range readings since 11/2021.  Impedance trends stable.  Clinic notified.  AMS episodes recorded, burden 8.7%.  Longest duration 2 hours 34 minutes 40 seconds.  Ventricular rates controlled.  EGMs show atrial arrhythmia with intermittent competitive atrial pacing due to atrial undersensing.  Unable to access Epic to confirm OAC status.  Follow up as scheduled. MC.  Successful telephone encounter to patient to assess for s/s intermittent AT/AF as episodes frequency and burden is increasing. Assisted patient with sending manual transmission. Most recent episode 04/20/22 for 2 hours 17 min peak A/V 341/86. Patient denies symptoms. Compliant with Eliquis and Coreg as prescribed.

## 2022-04-30 ENCOUNTER — Ambulatory Visit (INDEPENDENT_AMBULATORY_CARE_PROVIDER_SITE_OTHER): Payer: Medicare PPO

## 2022-04-30 DIAGNOSIS — I442 Atrioventricular block, complete: Secondary | ICD-10-CM

## 2022-04-30 LAB — CUP PACEART REMOTE DEVICE CHECK
Battery Remaining Longevity: 86 mo
Battery Remaining Percentage: 74 %
Battery Voltage: 3.04 V
Brady Statistic AP VP Percent: 29 %
Brady Statistic AP VS Percent: 1.6 %
Brady Statistic AS VP Percent: 56 %
Brady Statistic AS VS Percent: 5.7 %
Brady Statistic RA Percent Paced: 20 %
Brady Statistic RV Percent Paced: 84 %
Date Time Interrogation Session: 20230714124240
Implantable Lead Implant Date: 20201009
Implantable Lead Implant Date: 20201009
Implantable Lead Location: 753859
Implantable Lead Location: 753860
Implantable Pulse Generator Implant Date: 20201009
Lead Channel Impedance Value: 450 Ohm
Lead Channel Impedance Value: 600 Ohm
Lead Channel Pacing Threshold Amplitude: 0.5 V
Lead Channel Pacing Threshold Amplitude: 0.75 V
Lead Channel Pacing Threshold Pulse Width: 0.5 ms
Lead Channel Pacing Threshold Pulse Width: 0.5 ms
Lead Channel Sensing Intrinsic Amplitude: 1.6 mV
Lead Channel Sensing Intrinsic Amplitude: 12 mV
Lead Channel Setting Pacing Amplitude: 1 V
Lead Channel Setting Pacing Amplitude: 2 V
Lead Channel Setting Pacing Pulse Width: 0.5 ms
Lead Channel Setting Sensing Sensitivity: 4 mV
Pulse Gen Model: 2272
Pulse Gen Serial Number: 9166894

## 2022-05-13 NOTE — Progress Notes (Signed)
Remote pacemaker transmission.   

## 2022-07-30 LAB — CUP PACEART REMOTE DEVICE CHECK
Battery Remaining Longevity: 79 mo
Battery Remaining Percentage: 72 %
Battery Voltage: 3.04 V
Brady Statistic AP VP Percent: 27 %
Brady Statistic AP VS Percent: 1 %
Brady Statistic AS VP Percent: 64 %
Brady Statistic AS VS Percent: 3 %
Brady Statistic RA Percent Paced: 20 %
Brady Statistic RV Percent Paced: 90 %
Date Time Interrogation Session: 20231013020016
Implantable Lead Implant Date: 20201009
Implantable Lead Implant Date: 20201009
Implantable Lead Location: 753859
Implantable Lead Location: 753860
Implantable Pulse Generator Implant Date: 20201009
Lead Channel Impedance Value: 400 Ohm
Lead Channel Impedance Value: 540 Ohm
Lead Channel Pacing Threshold Amplitude: 0.5 V
Lead Channel Pacing Threshold Amplitude: 0.75 V
Lead Channel Pacing Threshold Pulse Width: 0.5 ms
Lead Channel Pacing Threshold Pulse Width: 0.5 ms
Lead Channel Sensing Intrinsic Amplitude: 1.1 mV
Lead Channel Sensing Intrinsic Amplitude: 12 mV
Lead Channel Setting Pacing Amplitude: 1 V
Lead Channel Setting Pacing Amplitude: 2 V
Lead Channel Setting Pacing Pulse Width: 0.5 ms
Lead Channel Setting Sensing Sensitivity: 4 mV
Pulse Gen Model: 2272
Pulse Gen Serial Number: 9166894

## 2022-10-29 ENCOUNTER — Telehealth: Payer: Self-pay

## 2022-10-29 ENCOUNTER — Ambulatory Visit (INDEPENDENT_AMBULATORY_CARE_PROVIDER_SITE_OTHER): Payer: Medicare PPO

## 2022-10-29 DIAGNOSIS — I442 Atrioventricular block, complete: Secondary | ICD-10-CM | POA: Diagnosis not present

## 2022-10-29 LAB — CUP PACEART REMOTE DEVICE CHECK
Battery Remaining Longevity: 55 mo
Battery Remaining Percentage: 67 %
Battery Voltage: 3.01 V
Brady Statistic AP VP Percent: 20 %
Brady Statistic AP VS Percent: 1 %
Brady Statistic AS VP Percent: 73 %
Brady Statistic AS VS Percent: 2.2 %
Brady Statistic RA Percent Paced: 15 %
Brady Statistic RV Percent Paced: 93 %
Date Time Interrogation Session: 20240112020014
Implantable Lead Connection Status: 753985
Implantable Lead Connection Status: 753985
Implantable Lead Implant Date: 20201009
Implantable Lead Implant Date: 20201009
Implantable Lead Location: 753859
Implantable Lead Location: 753860
Implantable Pulse Generator Implant Date: 20201009
Lead Channel Impedance Value: 390 Ohm
Lead Channel Impedance Value: 450 Ohm
Lead Channel Pacing Threshold Amplitude: 0.5 V
Lead Channel Pacing Threshold Amplitude: 0.625 V
Lead Channel Pacing Threshold Pulse Width: 0.5 ms
Lead Channel Pacing Threshold Pulse Width: 0.5 ms
Lead Channel Sensing Intrinsic Amplitude: 1.2 mV
Lead Channel Sensing Intrinsic Amplitude: 12 mV
Lead Channel Setting Pacing Amplitude: 2 V
Lead Channel Setting Pacing Amplitude: 5 V
Lead Channel Setting Pacing Pulse Width: 0.5 ms
Lead Channel Setting Sensing Sensitivity: 4 mV
Pulse Gen Model: 2272
Pulse Gen Serial Number: 9166894

## 2022-10-29 NOTE — Telephone Encounter (Signed)
Patient scheduled for 11/03/2021 at 10:40

## 2022-10-29 NOTE — Telephone Encounter (Signed)
Alert from [employer]   Scheduled remote reviewed. Normal device function.   Ventricular threshold at high output, recent upward trend - route to triage AS<2:1, stable trend, appropriate sensing Known PAF, longest episode 3hrs 57min, asymptomatic per EPIC Burden 6.9%, Eliquis, Coreg Next remote 91 days. [place] phone line busy, left VM on cell. Patient needs to have in-clinic device check due to RV threshold at high output for the past 2 scheduled remotes

## 2022-11-03 ENCOUNTER — Ambulatory Visit: Payer: Medicare PPO | Attending: Cardiovascular Disease

## 2022-11-03 ENCOUNTER — Telehealth: Payer: Self-pay

## 2022-11-03 DIAGNOSIS — I442 Atrioventricular block, complete: Secondary | ICD-10-CM | POA: Diagnosis not present

## 2022-11-03 LAB — CUP PACEART INCLINIC DEVICE CHECK
Battery Remaining Longevity: 60 mo
Battery Voltage: 3.01 V
Brady Statistic RA Percent Paced: 15 %
Brady Statistic RV Percent Paced: 93 %
Date Time Interrogation Session: 20240117201554
Implantable Lead Connection Status: 753985
Implantable Lead Connection Status: 753985
Implantable Lead Implant Date: 20201009
Implantable Lead Implant Date: 20201009
Implantable Lead Location: 753859
Implantable Lead Location: 753860
Implantable Pulse Generator Implant Date: 20201009
Lead Channel Impedance Value: 400 Ohm
Lead Channel Impedance Value: 462.5 Ohm
Lead Channel Pacing Threshold Amplitude: 0.5 V
Lead Channel Pacing Threshold Amplitude: 0.5 V
Lead Channel Pacing Threshold Amplitude: 0.75 V
Lead Channel Pacing Threshold Amplitude: 0.75 V
Lead Channel Pacing Threshold Pulse Width: 0.5 ms
Lead Channel Pacing Threshold Pulse Width: 0.5 ms
Lead Channel Pacing Threshold Pulse Width: 0.5 ms
Lead Channel Pacing Threshold Pulse Width: 0.5 ms
Lead Channel Sensing Intrinsic Amplitude: 1.9 mV
Lead Channel Sensing Intrinsic Amplitude: 9.2 mV
Lead Channel Setting Pacing Amplitude: 2 V
Lead Channel Setting Pacing Amplitude: 2.5 V
Lead Channel Setting Pacing Pulse Width: 0.5 ms
Lead Channel Setting Sensing Sensitivity: 4 mV
Pulse Gen Model: 2272
Pulse Gen Serial Number: 9166894

## 2022-11-03 NOTE — Telephone Encounter (Signed)
Spoke with the pts son and he is unable to come for an appt tomorrow due to another appt so he will come this Friday 11/05/22 to see Dr Angelena Form... he says the pt is not uncomfortable and has been experiencing leg weakness and edema for quite some time... he will elevate his legs when sitting, avoid NA and call if anything worsens prior to his appt.

## 2022-11-03 NOTE — Progress Notes (Signed)
Patient in today to check his device for repeated RV threshold at high output.  Device interrogation performed today with assistance from [employer]. Jude. Programming changes made to maximize device function and eliminate high output. Device function normal.  Patient does have signs of fluid retention with BLE edema and weakness.  Referred patient to follow up with his general cardiologist, has appointment with them this week.  Patient past due for annual follow up with Dr. Lovena Le, appointment made at checkout for April.  He is scheduled for every 3 month remote checks.   Programming changes made this session:

## 2022-11-03 NOTE — Telephone Encounter (Signed)
Patient seen today in device clinic for high RV threshold alert.  Programming adjustments made to maximize device function.   While in clinic today, son report that patient has been having increasing lower extremity weakness. Also, note patient has increased BLE edema and signs of fluid retention.   Patient has been seen by Dr. Tamala Julian in 2021 and then by Leanor Kail, [place] in March, 2023.  Likely needs appointment with gen Cards soon to follow up on heart failure and s/s of fluid on board.   Device function normal.  Patient has had some periods of Aflutter/AF which could also be contributing To fluid retention.  He is on an [place]. Will forward to gen card triage to determine next best steps and who patient should see for follow up as Dr. Tamala Julian has retired.

## 2022-11-03 NOTE — Patient Instructions (Addendum)
Programming changes have been made to your device today to maximize its' function. Do note some increased lower extremity edema and complaints of lower extremity weakness. We will have your general cardiologist reach out to you and follow up.  If you do not hear from their office today, please call them at [phone].   Keep your scheduled remote device appointments and you are due to follow up with Dr. Lovena Le. Will have you make that appointment on the way out today.

## 2022-11-05 ENCOUNTER — Ambulatory Visit: Payer: Medicare PPO | Attending: Cardiovascular Disease | Admitting: Cardiovascular Disease

## 2022-11-05 ENCOUNTER — Encounter: Payer: Self-pay | Admitting: Cardiovascular Disease

## 2022-11-05 VITALS — BP 106/62 | HR 59 | Ht 70.0 in | Wt 156.0 lb

## 2022-11-05 DIAGNOSIS — I442 Atrioventricular block, complete: Secondary | ICD-10-CM | POA: Diagnosis not present

## 2022-11-05 DIAGNOSIS — I5022 Chronic systolic (congestive) heart failure: Secondary | ICD-10-CM

## 2022-11-05 DIAGNOSIS — I10 Essential (primary) hypertension: Secondary | ICD-10-CM | POA: Diagnosis not present

## 2022-11-05 DIAGNOSIS — I428 Other cardiomyopathies: Secondary | ICD-10-CM

## 2022-11-05 DIAGNOSIS — E782 Mixed hyperlipidemia: Secondary | ICD-10-CM

## 2022-11-05 MED ORDER — FUROSEMIDE 20 MG PO TABS
20.0000 mg | ORAL_TABLET | Freq: Every day | ORAL | 3 refills | Status: DC | PRN
Start: 2022-11-05 — End: 2023-01-24

## 2022-11-05 NOTE — Progress Notes (Signed)
Chief Complaint  Patient presents with   Follow-up    NICM   History of Present Illness: 88 yo male with history of atrial fibrillation, complete heart block s/p pacemaker, PVCs, HTN, hyperlipidemia, hypothyroidism, PVCs, chronic systolic CHF, CKD stage 3, chronic anemia and remote stroke who is here today for follow up. He has been followed in our office by Dr. Tamala Julian and Dr. Lovena Le. I am seeing him today for the first time following Dr. Thompson Caul retirement. He had a a pacemaker placed in October 2020 for complete heart block. Echo December 2022 with LVEF=45-50%. Atrial fib found on his pacemaker in May 2023. He is now on Eliquis.   He is here today for follow up. The patient denies any chest pain, dyspnea, palpitations, orthopnea, PND, dizziness, near syncope or syncope. He has swelling in his ankles over the last few weeks.   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   Right bundle, left anterior hemiblock, and first degree AV block, June, 2015    Ventricular bigeminy 06/27/2014    Past Surgical History:  Procedure Laterality Date   appendectomy     COLONOSCOPY WITH PROPOFOL N/A 04/09/2016   Procedure: COLONOSCOPY WITH PROPOFOL;  Surgeon: Carol Ada, MD;  Location: [facility];  Service: Endoscopy;  Laterality: N/A;   PACEMAKER IMPLANT N/A 07/27/2019   Procedure: PACEMAKER IMPLANT;  Surgeon: Evans Lance, MD;  Location: [facility];  Service: Cardiovascular;  Laterality: N/A;   TEMPORARY  PACEMAKER N/A 07/26/2019   Procedure: TEMPORARY PACEMAKER;  Surgeon: Burnell Blanks, MD;  Location: [facility];  Service: Cardiovascular;  Laterality: N/A;    Current Outpatient Medications  Medication Sig Dispense Refill   allopurinol (ZYLOPRIM) 300 MG tablet TAKE ONE TABLET BY MOUTH EVERY DAY (Patient taking differently: Take 300 mg by mouth daily.) 30 tablet 5   apixaban (ELIQUIS) 2.5 MG TABS tablet Take 1 tablet (2.5 mg total) by mouth 2 (two) times daily. 60 tablet 3   carvedilol (COREG) 6.25 MG tablet Take 1 tablet (6.25 mg total) by mouth 2 (two) times daily. 180 tablet 2   Cholecalciferol (VITAMIN D3) 10 MCG (400 UNIT) CHEW Chew 10 mcg by mouth daily.     furosemide (LASIX) 20 MG tablet Take 1 tablet (20 mg total) by mouth daily as needed. 30 tablet 3   levothyroxine (SYNTHROID, LEVOTHROID) 25 MCG tablet Take 25 mcg by mouth daily.     losartan-hydrochlorothiazide (HYZAAR) 50-12.5 MG tablet TAKE ONE TABLET BY MOUTH DAILY (Patient taking differently: Take 1 tablet by mouth daily.) 90 tablet 3   omeprazole (PRILOSEC) 40 MG capsule Take 1 capsule (40 mg total) by mouth daily. 30 capsule 1   rosuvastatin (CRESTOR) 5 MG tablet Take 1 tablet (5 mg total) by mouth 3 (three) times a week. 12 tablet 1   No current facility-administered medications for this visit.    No Known Allergies  Social History   Socioeconomic History   Marital status:  Married    Spouse name: Not on file   Number of children: 2   Years of education: Not on file   Highest education level: Not on file  Occupational History   Occupation: retired  Tobacco Use   Smoking status: Former   Smokeless tobacco: Current    Types: Associate Professor Use: Never used  Substance and Sexual Activity   Alcohol use: No   Drug use: No   Sexual activity: Not on file  Other Topics Concern   Not on file  Social History Narrative   Not on file   Social Determinants of Health   Financial Resource  Strain: Not on file  Food Insecurity: Not on file  Transportation Needs: Not on file  Physical Activity: Not on file  Stress: Not on file  Social Connections: Not on file  Intimate Partner Violence: Not on file    Family History  Problem Relation Age of Onset   CAD Mother    Cancer Father    Heart disease Other    Hypertension Other    Cancer Other     Review of Systems:  As stated in the HPI and otherwise negative.   BP 106/62   Pulse (!) 59   Ht 5\' 10"  (1.778 m)   Wt 70.8 kg   SpO2 97%   BMI 22.38 kg/m   Physical Examination: General: Well developed, well nourished, NAD  HEENT: OP clear, mucus membranes moist  SKIN: warm, dry. No rashes. Neuro: No focal deficits  Musculoskeletal: Muscle strength 5/5 all ext  Psychiatric: Mood and affect normal  Neck: No JVD, no carotid bruits, no thyromegaly, no lymphadenopathy.  Lungs:Clear bilaterally, no wheezes, rhonci, crackles Cardiovascular: Regular rate and rhythm. No murmurs, gallops or rubs. Abdomen:Soft. Bowel sounds present. Non-tender.  Extremities: 1+ edema bilateral ankles/feet.   EKG:  EKG is not ordered today. The ekg ordered today demonstrates   Echo 09/2021 1. Left ventricular ejection fraction, by estimation, is 45 to 50%. The  left ventricle has mildly decreased function. The left ventricle  demonstrates global hypokinesis, distal anteroseptal and apical wall  motion likely secondary to RV pacing. There is  moderate left ventricular hypertrophy. Left ventricular diastolic  parameters are consistent with Grade I diastolic dysfunction (impaired  relaxation).   2. Right ventricular systolic function is normal. The right ventricular  size is normal. Tricuspid regurgitation signal is inadequate for assessing  PA pressure.   3. Left atrial size was severely dilated.   4. The mitral valve is grossly normal. Mild to moderate mitral valve  regurgitation.   5. The aortic valve is tricuspid. There is mild  calcification of the  aortic valve. Aortic valve regurgitation is mild. Aortic valve  sclerosis/calcification is present, without any evidence of aortic  stenosis. Aortic regurgitation PHT measures 511 msec.   6. Unable to estimate CVP.   Recent Labs: No results found for requested labs within last 365 days.   Lipid Panel    Component Value Date/Time   CHOL 110 10/11/2021 0431   TRIG 78 10/11/2021 0431   HDL 21 (L) 10/11/2021 0431   CHOLHDL 5.2 10/11/2021 0431   VLDL 16 10/11/2021 0431   LDLCALC 73 10/11/2021 0431     Wt Readings from Last 3 Encounters:  11/05/22 70.8 kg  03/26/22 75.1 kg  01/07/22 78.5 kg    Assessment and Plan:   1. Complete heart block: Pacemaker in place  2. Non-ischemic cardiomyopathy/Chronic systolic CHF: LVEF=45-50%  by echo in December 2022. He does have pedal edema. Will start Lasix 20 mg po daily for 4 days then as needed for swelling. Continue Coreg and Losartan. BMET today  3. HTN: BP is well controlled  4. Hyperlipidemia: Lipids followed in primary care. Continue statin.   Labs/ tests ordered today include:   Orders Placed This Encounter  Procedures   Basic Metabolic Panel (BMET)   Disposition:   F/U with me in 12 months.   Signed, Lauree Chandler, MD, [facility] 11/05/2022 3:54 PM    [address], [place], [place]  [address] Phone: [phone]; Fax: [phone]

## 2022-11-05 NOTE — Patient Instructions (Signed)
Medication Instructions:  Your physician has recommended you make the following change in your medication:  1.) start furosemide (Lasix) 20 mg - take one tablet daily for FOUR days, then take only as needed  *If you need a refill on your cardiac medications before your next appointment, please call your pharmacy*   Lab Work: Today: BMET If you have labs (blood work) drawn today and your tests are completely normal, you will receive your results only by: [place] (if you have MyChart) OR A paper copy in the mail If you have any lab test that is abnormal or we need to change your treatment, we will call you to review the results.   Testing/Procedures: none   Follow-Up: As planned

## 2022-11-06 LAB — BASIC METABOLIC PANEL
BUN/Creatinine Ratio: 13 (ref 10–24)
BUN: 20 mg/dL (ref 8–27)
CO2: 22 mmol/L (ref 20–29)
Calcium: 8.1 mg/dL — ABNORMAL LOW (ref 8.6–10.2)
Chloride: 102 mmol/L (ref 96–106)
Creatinine, Ser: 1.53 mg/dL — ABNORMAL HIGH (ref 0.76–1.27)
Glucose: 85 mg/dL (ref 70–99)
Potassium: 3.5 mmol/L (ref 3.5–5.2)
Sodium: 137 mmol/L (ref 134–144)
eGFR: 44 mL/min/{1.73_m2} — ABNORMAL LOW (ref >59)

## 2022-11-16 NOTE — Progress Notes (Signed)
Remote pacemaker transmission.   

## 2023-01-24 ENCOUNTER — Other Ambulatory Visit: Payer: Self-pay | Admitting: *Deleted

## 2023-01-24 MED ORDER — FUROSEMIDE 20 MG PO TABS: 20.0000 mg | ORAL_TABLET | Freq: Every day | ORAL | 2 refills | Status: DC | PRN

## 2023-01-26 ENCOUNTER — Ambulatory Visit: Payer: Medicare PPO | Attending: Internal Medicine | Admitting: Internal Medicine

## 2023-01-28 ENCOUNTER — Ambulatory Visit (INDEPENDENT_AMBULATORY_CARE_PROVIDER_SITE_OTHER): Payer: Medicare PPO

## 2023-01-28 DIAGNOSIS — I442 Atrioventricular block, complete: Secondary | ICD-10-CM

## 2023-01-30 LAB — CUP PACEART REMOTE DEVICE CHECK
Battery Remaining Longevity: 67 mo
Battery Remaining Percentage: 64 %
Battery Voltage: 3.04 V
Brady Statistic AP VP Percent: 13 %
Brady Statistic AP VS Percent: 1 %
Brady Statistic AS VP Percent: 84 %
Brady Statistic AS VS Percent: 1.3 %
Brady Statistic RA Percent Paced: 11 %
Brady Statistic RV Percent Paced: 97 %
Date Time Interrogation Session: 20240412164936
Implantable Lead Connection Status: 753985
Implantable Lead Connection Status: 753985
Implantable Lead Implant Date: 20201009
Implantable Lead Implant Date: 20201009
Implantable Lead Location: 753859
Implantable Lead Location: 753860
Implantable Pulse Generator Implant Date: 20201009
Lead Channel Impedance Value: 400 Ohm
Lead Channel Impedance Value: 440 Ohm
Lead Channel Pacing Threshold Amplitude: 0.5 V
Lead Channel Pacing Threshold Amplitude: 0.75 V
Lead Channel Pacing Threshold Pulse Width: 0.5 ms
Lead Channel Pacing Threshold Pulse Width: 0.5 ms
Lead Channel Sensing Intrinsic Amplitude: 1 mV
Lead Channel Sensing Intrinsic Amplitude: 11.3 mV
Lead Channel Setting Pacing Amplitude: 2 V
Lead Channel Setting Pacing Amplitude: 2.5 V
Lead Channel Setting Pacing Pulse Width: 0.5 ms
Lead Channel Setting Sensing Sensitivity: 4 mV
Pulse Gen Model: 2272
Pulse Gen Serial Number: 9166894

## 2023-02-14 ENCOUNTER — Ambulatory Visit: Payer: Medicare PPO | Admitting: Interventional Cardiology

## 2023-03-03 NOTE — Progress Notes (Signed)
Remote pacemaker transmission.   

## 2023-03-15 ENCOUNTER — Emergency Department (HOSPITAL_COMMUNITY): Payer: Medicare PPO

## 2023-03-15 ENCOUNTER — Other Ambulatory Visit: Payer: Self-pay

## 2023-03-15 ENCOUNTER — Emergency Department (HOSPITAL_COMMUNITY)
Admission: EM | Admit: 2023-03-15 | Discharge: 2023-03-17 | Disposition: A | Discharge status: Skilled Nursing Facility | Payer: Medicare PPO | Attending: Emergency Medicine | Admitting: Emergency Medicine

## 2023-03-15 ENCOUNTER — Encounter (HOSPITAL_COMMUNITY): Payer: Self-pay

## 2023-03-15 DIAGNOSIS — Z8673 Personal history of transient ischemic attack (TIA), and cerebral infarction without residual deficits: Secondary | ICD-10-CM | POA: Insufficient documentation

## 2023-03-15 DIAGNOSIS — R531 Weakness: Secondary | ICD-10-CM | POA: Insufficient documentation

## 2023-03-15 DIAGNOSIS — R000 Tachycardia, unspecified: Secondary | ICD-10-CM | POA: Insufficient documentation

## 2023-03-15 DIAGNOSIS — Z950 Presence of cardiac pacemaker: Secondary | ICD-10-CM | POA: Insufficient documentation

## 2023-03-15 DIAGNOSIS — M25561 Pain in right knee: Secondary | ICD-10-CM | POA: Diagnosis not present

## 2023-03-15 DIAGNOSIS — R410 Disorientation, unspecified: Secondary | ICD-10-CM | POA: Diagnosis not present

## 2023-03-15 DIAGNOSIS — Z7901 Long term (current) use of anticoagulants: Secondary | ICD-10-CM | POA: Insufficient documentation

## 2023-03-15 LAB — URINALYSIS, ROUTINE W REFLEX MICROSCOPIC
Bilirubin Urine: NEGATIVE
Glucose, UA: NEGATIVE mg/dL
Ketones, ur: NEGATIVE mg/dL
Leukocytes,Ua: NEGATIVE
Nitrite: NEGATIVE
Protein, ur: NEGATIVE mg/dL
Specific Gravity, Urine: 1.012 (ref 1.005–1.030)
pH: 5 (ref 5.0–8.0)

## 2023-03-15 LAB — COMPREHENSIVE METABOLIC PANEL
ALT: 13 U/L (ref 0–44)
AST: 24 U/L (ref 15–41)
Albumin: 2.4 g/dL — ABNORMAL LOW (ref 3.5–5.0)
Alkaline Phosphatase: 79 U/L (ref 38–126)
Anion gap: 13 (ref 5–15)
BUN: 39 mg/dL — ABNORMAL HIGH (ref 8–23)
CO2: 18 mmol/L — ABNORMAL LOW (ref 22–32)
Calcium: 8.3 mg/dL — ABNORMAL LOW (ref 8.9–10.3)
Chloride: 104 mmol/L (ref 98–111)
Creatinine, Ser: 1.32 mg/dL — ABNORMAL HIGH (ref 0.61–1.24)
GFR, Estimated: 53 mL/min — ABNORMAL LOW (ref >60)
Glucose, Bld: 104 mg/dL — ABNORMAL HIGH (ref 70–99)
Potassium: 3.8 mmol/L (ref 3.5–5.1)
Sodium: 135 mmol/L (ref 135–145)
Total Bilirubin: 1.4 mg/dL — ABNORMAL HIGH (ref 0.3–1.2)
Total Protein: 7.2 g/dL (ref 6.5–8.1)

## 2023-03-15 LAB — CBC
HCT: 33.2 % — ABNORMAL LOW (ref 39.0–52.0)
Hemoglobin: 10.7 g/dL — ABNORMAL LOW (ref 13.0–17.0)
MCH: 28.9 pg (ref 26.0–34.0)
MCHC: 32.2 g/dL (ref 30.0–36.0)
MCV: 89.7 fL (ref 80.0–100.0)
Platelets: 254 10*3/uL (ref 150–400)
RBC: 3.7 MIL/uL — ABNORMAL LOW (ref 4.22–5.81)
RDW: 15.9 % — ABNORMAL HIGH (ref 11.5–15.5)
WBC: 6.4 10*3/uL (ref 4.0–10.5)
nRBC: 0 % (ref 0.0–0.2)

## 2023-03-15 LAB — TROPONIN I (HIGH SENSITIVITY)
Troponin I (High Sensitivity): 38 ng/L — ABNORMAL HIGH (ref <18)
Troponin I (High Sensitivity): 32 ng/L — ABNORMAL HIGH (ref <18)

## 2023-03-15 LAB — LIPASE, BLOOD: Lipase: 41 U/L (ref 11–51)

## 2023-03-15 LAB — MAGNESIUM: Magnesium: 2.3 mg/dL (ref 1.7–2.4)

## 2023-03-15 MED ORDER — LEVOTHYROXINE SODIUM 50 MCG PO TABS: 25.0000 ug | ORAL_TABLET | Freq: Every day | ORAL | Status: DC

## 2023-03-15 MED ORDER — HYDROCHLOROTHIAZIDE 12.5 MG PO TABS
12.5000 mg | ORAL_TABLET | Freq: Every day | ORAL | Status: DC
  Administered 2023-03-16 – 2023-03-17 (×2): 12.5 mg via ORAL
  Filled 2023-03-15 (×2): qty 1

## 2023-03-15 MED ORDER — PANTOPRAZOLE SODIUM 40 MG PO TBEC
40.0000 mg | DELAYED_RELEASE_TABLET | Freq: Every day | ORAL | Status: DC
  Administered 2023-03-16 – 2023-03-17 (×2): 40 mg via ORAL
  Filled 2023-03-15 (×2): qty 1

## 2023-03-15 MED ORDER — CARVEDILOL 3.125 MG PO TABS
6.2500 mg | ORAL_TABLET | Freq: Two times a day (BID) | ORAL | Status: DC
  Administered 2023-03-16 – 2023-03-17 (×3): 6.25 mg via ORAL
  Filled 2023-03-15 (×3): qty 2

## 2023-03-15 MED ORDER — LOSARTAN POTASSIUM 25 MG PO TABS
50.0000 mg | ORAL_TABLET | Freq: Every day | ORAL | Status: DC
  Administered 2023-03-16 – 2023-03-17 (×2): 50 mg via ORAL
  Filled 2023-03-15 (×2): qty 2

## 2023-03-15 MED ORDER — SODIUM CHLORIDE 0.9 % IV BOLUS
1000.0000 mL | Freq: Once | INTRAVENOUS | Status: AC
  Administered 2023-03-15: 1000 mL via INTRAVENOUS

## 2023-03-15 MED ORDER — LEVOTHYROXINE SODIUM 50 MCG PO TABS
25.0000 ug | ORAL_TABLET | Freq: Every day | ORAL | Status: DC
  Administered 2023-03-16 – 2023-03-17 (×2): 25 ug via ORAL
  Filled 2023-03-15 (×2): qty 1

## 2023-03-15 MED ORDER — APIXABAN 2.5 MG PO TABS
2.5000 mg | ORAL_TABLET | Freq: Two times a day (BID) | ORAL | Status: DC
  Administered 2023-03-16 – 2023-03-17 (×3): 2.5 mg via ORAL
  Filled 2023-03-15 (×3): qty 1

## 2023-03-15 MED ORDER — LOSARTAN POTASSIUM-HCTZ 50-12.5 MG PO TABS: 1.0000 | ORAL_TABLET | Freq: Every day | ORAL | Status: DC

## 2023-03-15 MED ORDER — ALLOPURINOL 300 MG PO TABS
300.0000 mg | ORAL_TABLET | Freq: Every day | ORAL | Status: DC
  Administered 2023-03-16 – 2023-03-17 (×2): 300 mg via ORAL
  Filled 2023-03-15 (×2): qty 1

## 2023-03-15 MED ORDER — SODIUM CHLORIDE 0.9 % IV SOLN: Freq: Once | INTRAVENOUS | Status: AC

## 2023-03-15 NOTE — ED Provider Notes (Signed)
[place] EMERGENCY DEPARTMENT AT [facility] Provider Note   CSN: [id number] Arrival date & time: 03/15/23  1906     History {Add pertinent medical, surgical, social history, OB history to HPI:1} Chief Complaint  Patient presents with   Weakness   Dehydration    Steven Voytko. is a 88 y.o. male.  He has a history of stroke, heart block, pacemaker.  He is brought in by EMS from home for general weakness.  Not eating or drinking much and has not walked in 2 weeks.  He cannot tell me why he is not walking.  He has chronic right knee pain from arthritis.  He denies any headache fevers cough shortness of breath abdominal pain vomiting diarrhea or urinary symptoms.  He lives with his son.  The history is provided by the patient and the EMS personnel.  Weakness Severity:  Moderate Onset quality:  Gradual Duration:  2 weeks Timing:  Constant Progression:  Worsening Chronicity:  New Relieved by:  Nothing Worsened by:  Activity Ineffective treatments:  Rest Associated symptoms: difficulty walking   Associated symptoms: no abdominal pain, no chest pain, no cough, no dysuria, no fever, no nausea and no vomiting        Home Medications Prior to Admission medications   Medication Sig Start Date End Date Taking? Authorizing Provider  allopurinol (ZYLOPRIM) 300 MG tablet TAKE ONE TABLET BY MOUTH EVERY DAY Patient taking differently: Take 300 mg by mouth daily. 06/02/17   Darreld Mclean, MD  apixaban (ELIQUIS) 2.5 MG TABS tablet Take 1 tablet (2.5 mg total) by mouth 2 (two) times daily. 03/05/22   Marinus Maw, MD  carvedilol (COREG) 6.25 MG tablet Take 1 tablet (6.25 mg total) by mouth 2 (two) times daily. 10/03/20   Sheilah Pigeon, PA-C  Cholecalciferol (VITAMIN D3) 10 MCG (400 UNIT) CHEW Chew 10 mcg by mouth daily.    [provider]  furosemide (LASIX) 20 MG tablet Take 1 tablet (20 mg total) by mouth daily as needed. 01/24/23   Kathleene Hazel, MD   levothyroxine (SYNTHROID, LEVOTHROID) 25 MCG tablet Take 25 mcg by mouth daily.    [provider]  losartan-hydrochlorothiazide (HYZAAR) 50-12.5 MG tablet TAKE ONE TABLET BY MOUTH DAILY Patient taking differently: Take 1 tablet by mouth daily. 07/10/20   Lyn Records, MD  omeprazole (PRILOSEC) 40 MG capsule Take 1 capsule (40 mg total) by mouth daily. 10/11/21   Johnson, Clanford L, MD  rosuvastatin (CRESTOR) 5 MG tablet Take 1 tablet (5 mg total) by mouth 3 (three) times a week. 10/12/21   Cleora Fleet, MD      Allergies    Patient has no known allergies.    Review of Systems   Review of Systems  Constitutional:  Positive for appetite change. Negative for fever.  Respiratory:  Negative for cough.   Cardiovascular:  Negative for chest pain.  Gastrointestinal:  Negative for abdominal pain, nausea and vomiting.  Genitourinary:  Negative for dysuria.  Neurological:  Positive for weakness.    Physical Exam Updated Vital Signs BP 105/68   Resp (!) 28   SpO2 100%  Physical Exam Vitals and nursing note reviewed.  Constitutional:      General: He is not in acute distress.    Appearance: Normal appearance. He is well-developed.  HENT:     Head: Normocephalic and atraumatic.  Eyes:     Conjunctiva/sclera: Conjunctivae normal.  Cardiovascular:     Rate and  Rhythm: Tachycardia present. Rhythm irregular.     Heart sounds: No murmur heard. Pulmonary:     Effort: Pulmonary effort is normal. No respiratory distress.     Breath sounds: Normal breath sounds.  Abdominal:     Palpations: Abdomen is soft.     Tenderness: There is no abdominal tenderness. There is no guarding or rebound.  Musculoskeletal:        General: Tenderness (right knee) present.     Cervical back: Neck supple.     Right lower leg: No edema.     Left lower leg: No edema.  Skin:    General: Skin is warm and dry.     Capillary Refill: Capillary refill takes less than 2 seconds.  Neurological:      General: No focal deficit present.     Mental Status: He is alert. He is disoriented.     Motor: No weakness.     Comments: She is awake and alert.  He is not oriented to time.  He is moving all extremities without any difficulty.     ED Results / Procedures / Treatments   Labs (all labs ordered are listed, but only abnormal results are displayed) Labs Reviewed  CBC  URINALYSIS, ROUTINE W REFLEX MICROSCOPIC  CBG MONITORING, ED    EKG None  Radiology No results found.  Procedures Procedures  {Document cardiac monitor, telemetry assessment procedure when appropriate:1}  Medications Ordered in ED Medications  sodium chloride 0.9 % bolus 1,000 mL (has no administration in time range)    ED Course/ Medical Decision Making/ A&P   {   Click here for ABCD2, HEART and other calculatorsREFRESH Note before signing :1}                          Medical Decision Making Amount and/or Complexity of Data Reviewed Labs: ordered. Radiology: ordered.   This patient complains of ***; this involves an extensive number of treatment Options and is a complaint that carries with it a high risk of complications and morbidity. The differential includes ***  I ordered, reviewed and interpreted labs, which included *** I ordered medication *** and reviewed PMP when indicated. I ordered imaging studies which included *** and I independently    visualized and interpreted imaging which showed *** Additional history obtained from *** Previous records obtained and reviewed *** I consulted *** and discussed lab and imaging findings and discussed disposition.  Cardiac monitoring reviewed, *** Social determinants considered, *** Critical Interventions: ***  After the interventions stated above, I reevaluated the patient and found *** Admission and further testing considered, ***   {Document critical care time when appropriate:1} {Document review of labs and clinical decision tools ie heart  score, Chads2Vasc2 etc:1}  {Document your independent review of radiology images, and any outside records:1} {Document your discussion with family members, caretakers, and with consultants:1} {Document social determinants of health affecting pt's care:1} {Document your decision making why or why not admission, treatments were needed:1} Final Clinical Impression(s) / ED Diagnoses Final diagnoses:  None    Rx / DC Orders ED Discharge Orders     None

## 2023-03-15 NOTE — ED Triage Notes (Signed)
Pt via [facility] from home c/o weakness and not being able to walk x2 weeks. Pt has not had anything to eat or drink in a few days. Pt smells of UTI and has been sitting in soiled depend.

## 2023-03-15 NOTE — ED Notes (Signed)
Called pt's son, [place], at pt's request and left VM for him to please come to the hospital.

## 2023-03-16 ENCOUNTER — Encounter: Payer: Self-pay | Admitting: Physician Assistant

## 2023-03-16 NOTE — NC FL2 (Signed)
[place] MEDICAID FL2 LEVEL OF CARE FORM     IDENTIFICATION  Patient Name: Steven LEAP Sr. Birthdate: 1935-05-21 Sex: male Admission Date (Current Location): 03/15/2023  [facility] and [place] Number:  [employer] and Address:  [facility],  [address] [place] [address]      Provider Number: [id number]  Attending Physician Name and Address:  Default, Provider, MD  Relative Name and Phone Number:       Current Level of Care: Hospital Recommended Level of Care: Skilled Nursing Facility Prior Approval Number:    Date Approved/Denied:   PASRR Number: [id number]  Discharge Plan: SNF    Current Diagnoses: Patient Active Problem List   Diagnosis Date Noted   Right arm numbness 10/10/2021   Near syncope 10/10/2021   Chronic HFrEF (heart failure with reduced ejection fraction) (HCC) 10/10/2021   NICM (nonischemic cardiomyopathy) (HCC) 10/10/2021   Pacemaker 10/30/2019   Complete heart block (HCC) 07/26/2019   Excessive thirst 12/19/2015   Hyperlipemia 12/19/2015   Knee pain 12/19/2015   Malaise and fatigue 12/19/2015   Multiple and bilateral precerebral artery stenosis 12/19/2015   Numbness and tingling of foot 12/19/2015   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/19/2015   Sensation of cold in lower extremity 12/19/2015   Shoulder injury 12/19/2015   Sinus bradycardia 12/19/2015   Osteoarthritis of knee 12/19/2015   Allergic rhinitis 11/14/2015   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   Bradycardia 06/27/2014   Bigeminy 06/27/2014   Ventricular bigeminy 06/27/2014   Essential hypertension 04/01/2014   Adult hypothyroidism 04/01/2014   CVA, old, hemiparesis (HCC) 04/01/2014   CKD (chronic kidney disease), stage III (HCC) 04/01/2014   Osteoarthritis of right knee 04/01/2014   Hemiplegia (HCC) 04/01/2014    Orientation RESPIRATION BLADDER Height & Weight     Self, Place  Normal Incontinent Weight:   Height:     BEHAVIORAL SYMPTOMS/MOOD NEUROLOGICAL  BOWEL NUTRITION STATUS      Incontinent Diet (Regular.)  AMBULATORY STATUS COMMUNICATION OF NEEDS Skin   Extensive Assist Verbally Normal                       Personal Care Assistance Level of Assistance  Bathing, Feeding, Dressing Bathing Assistance: Maximum assistance Feeding assistance: Limited assistance Dressing Assistance: Maximum assistance     Functional Limitations Info  Sight, Hearing, Speech Sight Info: Adequate Hearing Info: Impaired Speech Info: Adequate    SPECIAL CARE FACTORS FREQUENCY  PT (By licensed PT)     PT Frequency: 5x weekly              Contractures      Additional Factors Info  Allergies   Allergies Info: No known allergies           Current Medications (03/16/2023):  This is the current hospital active medication list Current Facility-Administered Medications  Medication Dose Route Frequency Provider Last Rate Last Admin   allopurinol (ZYLOPRIM) tablet 300 mg  300 mg Oral Daily Terrilee Files, MD   300 mg at 03/16/23 0941   apixaban (ELIQUIS) tablet 2.5 mg  2.5 mg Oral BID Terrilee Files, MD   2.5 mg at 03/16/23 0942   carvedilol (COREG) tablet 6.25 mg  6.25 mg Oral BID Terrilee Files, MD   6.25 mg at 03/16/23 0943   losartan (COZAAR) tablet 50 mg  50 mg Oral Daily Terrilee Files, MD   50 mg at 03/16/23 7846   And   hydrochlorothiazide (HYDRODIURIL) tablet  12.5 mg  12.5 mg Oral Daily Terrilee Files, MD   12.5 mg at 03/16/23 6213   levothyroxine (SYNTHROID) tablet 25 mcg  25 mcg Oral Q0600 Terrilee Files, MD   25 mcg at 03/16/23 0719   pantoprazole (PROTONIX) EC tablet 40 mg  40 mg Oral Daily Terrilee Files, MD   40 mg at 03/16/23 0865   Current Outpatient Medications  Medication Sig Dispense Refill   allopurinol (ZYLOPRIM) 300 MG tablet TAKE ONE TABLET BY MOUTH EVERY DAY (Patient taking differently: Take 300 mg by mouth daily.) 30 tablet 5   carvedilol (COREG) 6.25 MG tablet Take 1 tablet (6.25 mg total) by  mouth 2 (two) times daily. 180 tablet 2   furosemide (LASIX) 20 MG tablet Take 1 tablet (20 mg total) by mouth daily as needed. 30 tablet 2   levothyroxine (SYNTHROID, LEVOTHROID) 25 MCG tablet Take 25 mcg by mouth daily.     losartan-hydrochlorothiazide (HYZAAR) 50-12.5 MG tablet TAKE ONE TABLET BY MOUTH DAILY (Patient taking differently: Take 1 tablet by mouth daily.) 90 tablet 3   rosuvastatin (CRESTOR) 5 MG tablet Take 1 tablet (5 mg total) by mouth 3 (three) times a week. 12 tablet 1     Discharge Medications: Please see discharge summary for a list of discharge medications.  Relevant Imaging Results:  Relevant Lab Results:   Additional Information SSN: [id number]  Karn Cassis, LCSW

## 2023-03-16 NOTE — Evaluation (Signed)
Physical Therapy Evaluation Patient Details Name: Steven CASTLES Sr. MRN: [id number] DOB: 06/25/1935 Today's Date: 03/16/2023  History of Present Illness  Steven Brackman. is a 88 y.o. male.  He has a history of stroke, heart block, pacemaker.  He is brought in by EMS from home for general weakness.  Not eating or drinking much and has not walked in 2 weeks.  He cannot tell me why he is not walking.  He has chronic right knee pain from arthritis.  He denies any headache fevers cough shortness of breath abdominal pain vomiting diarrhea or urinary symptoms.  He lives with his son.   Clinical Impression  Patient demonstrates slow labored movement for sitting up at bedside, very unsteady on feet and limited to a few slow labored side steps, with flexed trunk, buckling of knees before having to sit due to c/o fatigue and fall risk.  Patient tolerated sitting up in chair after therapy - nursing staff notified. Patient will benefit from continued skilled physical therapy in hospital and recommended venue below to increase strength, balance, endurance for safe ADLs and gait.          Recommendations for follow up therapy are one component of a multi-disciplinary discharge planning process, led by the attending physician.  Recommendations may be updated based on patient status, additional functional criteria and insurance authorization.  Follow Up Recommendations Can patient physically be transported by private vehicle: No     Assistance Recommended at Discharge Intermittent Supervision/Assistance  Patient can return home with the following  A lot of help with walking and/or transfers;A lot of help with bathing/dressing/bathroom;Assistance with cooking/housework;Help with stairs or ramp for entrance    Equipment Recommendations None recommended by PT  Recommendations for Other Services       Functional Status Assessment Patient has had a recent decline in their functional status and demonstrates the  ability to make significant improvements in function in a reasonable and predictable amount of time.     Precautions / Restrictions Precautions Precautions: Fall Restrictions Weight Bearing Restrictions: No      Mobility  Bed Mobility Overal bed mobility: Needs Assistance Bed Mobility: Supine to Sit     Supine to sit: Mod assist     General bed mobility comments: slow labored movement due to weakness    Transfers Overall transfer level: Needs assistance Equipment used: Rolling walker (2 wheels) Transfers: Sit to/from Stand, Bed to chair/wheelchair/BSC Sit to Stand: Mod assist   Step pivot transfers: Mod assist       General transfer comment: unsteady labored movment  with flexed trunk due to weakness    Ambulation/Gait Ambulation/Gait assistance: Mod assist, Max assist Gait Distance (Feet): 4 Feet Assistive device: Rolling walker (2 wheels) Gait Pattern/deviations: Decreased stance time - right, Decreased step length - left, Decreased stride length, Trunk flexed, Knees buckling       General Gait Details: limited to a few unsteady labored side steps with flexed trunks, frequent buckling of knees  Stairs            Wheelchair Mobility    Modified Rankin (Stroke Patients Only)       Balance Overall balance assessment: Needs assistance Sitting-balance support: Feet supported, Bilateral upper extremity supported Sitting balance-Leahy Scale: Fair Sitting balance - Comments: seated at EOB   Standing balance support: Reliant on assistive device for balance, Bilateral upper extremity supported Standing balance-Leahy Scale: Poor Standing balance comment: using RW  Pertinent Vitals/Pain Pain Assessment Pain Assessment: No/denies pain    Home Living Family/patient expects to be discharged to:: Private residence Living Arrangements: Spouse/significant other;Children Available Help at Discharge: Family;Available  PRN/intermittently Type of Home: House Home Access: Stairs to enter Entrance Stairs-Rails: Right;Left;Can reach both Entrance Stairs-Number of Steps: 3   Home Layout: One level Home Equipment: Agricultural consultant (2 wheels);Cane - single point;BSC/3in1;Wheelchair - manual      Prior Function Prior Level of Function : Needs assist       Physical Assist : Mobility (physical);ADLs (physical) Mobility (physical): Bed mobility;Transfers;Gait;Stairs   Mobility Comments: Household ambulator using RW or SPC ADLs Comments: Assisted by family     Hand Dominance        Extremity/Trunk Assessment   Upper Extremity Assessment Upper Extremity Assessment: Generalized weakness    Lower Extremity Assessment Lower Extremity Assessment: Generalized weakness    Cervical / Trunk Assessment Cervical / Trunk Assessment: Kyphotic  Communication   Communication: No difficulties  Cognition Arousal/Alertness: Awake/alert Behavior During Therapy: WFL for tasks assessed/performed Overall Cognitive Status: Within Functional Limits for tasks assessed                                          General Comments      Exercises     Assessment/Plan    PT Assessment Patient needs continued PT services  PT Problem List Decreased strength;Decreased activity tolerance;Decreased balance;Decreased mobility       PT Treatment Interventions DME instruction;Gait training;Stair training;Functional mobility training;Therapeutic activities;Therapeutic exercise;Balance training;Patient/family education    PT Goals (Current goals can be found in the Care Plan section)  Acute Rehab PT Goals Patient Stated Goal: return home PT Goal Formulation: With patient Time For Goal Achievement: 03/30/23 Potential to Achieve Goals: Good    Frequency Min 2X/week     Co-evaluation               AM-PAC PT "6 Clicks" Mobility  Outcome Measure Help needed turning from your back to your side  while in a flat bed without using bedrails?: A Lot Help needed moving from lying on your back to sitting on the side of a flat bed without using bedrails?: A Lot Help needed moving to and from a bed to a chair (including a wheelchair)?: A Lot Help needed standing up from a chair using your arms (e.g., wheelchair or bedside chair)?: A Lot Help needed to walk in hospital room?: A Lot Help needed climbing 3-5 steps with a railing? : Total 6 Click Score: 11    End of Session   Activity Tolerance: Patient tolerated treatment well;Patient limited by fatigue Patient left: in chair;with call bell/phone within reach Nurse Communication: Mobility status PT Visit Diagnosis: Unsteadiness on feet (R26.81);Other abnormalities of gait and mobility (R26.89);Muscle weakness (generalized) (M62.81)    Time: 9147-8295 PT Time Calculation (min) (ACUTE ONLY): 31 min   Charges:   PT Evaluation $PT Eval Moderate Complexity: 1 Mod PT Treatments $Therapeutic Activity: 23-37 mins        2:33 PM, 03/16/23 Ocie Bob, MPT Physical Therapist with [facility] [phone] office [phone] mobile phone

## 2023-03-16 NOTE — TOC Initial Note (Signed)
Transition of Care (TOC) - Initial/Assessment Note    Patient Details  Name: Steven APRAHAMIAN Sr. MRN: [id number] Date of Birth: 1935-10-14  Transition of Care [facility]) CM/SW Contact:    Leitha Bleak, RN Phone Number: 03/16/2023, 11:32 AM  Clinical Narrative:    Patient in ED with weakness. PT is recommending SNF.  CM spoke with his son. Patient lives with his wife, a brother and nephew. He uses a walker to ambulate. Son is agreeable to SNF and requesting [facility].  TOC sending out FL2 and contacted [facility] for a bed offer.  TOC starting INS AUTH       Expected Discharge Plan: Skilled Nursing Facility Barriers to Discharge: Continued Medical Work up   Patient Goals and CMS Choice Patient states their goals for this hospitalization and ongoing recovery are:: agreeable to SNF CMS Medicare.gov Compare Post Acute Care list provided to:: Patient Represenative (must comment) Choice offered to / list presented to : Adult Children      Expected Discharge Plan and Services     Post Acute Care Choice: Skilled Nursing Facility Living arrangements for the past 2 months: Single Family Home        Prior Living Arrangements/Services Living arrangements for the past 2 months: Single Family Home Lives with:: Spouse, Relatives            Current home services: DME   Permission Sought/Granted     SOn    Emotional Assessment     Affect (typically observed): Accepting   Alcohol / Substance Use: Not Applicable Psych Involvement: No (comment)  Admission diagnosis:  Weakness Patient Active Problem List   Diagnosis Date Noted   Right arm numbness 10/10/2021   Near syncope 10/10/2021   Chronic HFrEF (heart failure with reduced ejection fraction) (HCC) 10/10/2021   NICM (nonischemic cardiomyopathy) (HCC) 10/10/2021   Pacemaker 10/30/2019   Complete heart block (HCC) 07/26/2019   Excessive thirst 12/19/2015   Hyperlipemia 12/19/2015   Knee pain 12/19/2015   Malaise and fatigue 12/19/2015    Multiple and bilateral precerebral artery stenosis 12/19/2015   Numbness and tingling of foot 12/19/2015   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/19/2015   Sensation of cold in lower extremity 12/19/2015   Shoulder injury 12/19/2015   Sinus bradycardia 12/19/2015   Osteoarthritis of knee 12/19/2015   Allergic rhinitis 11/14/2015   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 [place] [address] Phone: [phone] Fax: [phone]     Social Determinants of Health (SDOH) Social History: SDOH Screenings   Tobacco Use: High Risk (03/15/2023)   SDOH Interventions:     Readmission Risk Interventions     No data to display

## 2023-03-16 NOTE — ED Notes (Signed)
Patient sat up and given breakfast tray

## 2023-03-16 NOTE — ED Provider Notes (Signed)
Emergency Medicine Observation Re-evaluation Note  Steven Fiss. is a 88 y.o. male, seen on rounds today.  Pt initially presented to the ED for complaints of Weakness and Dehydration Currently, the patient is awaiting TOC consult.  Physical Exam  BP 118/73   Pulse 71   Temp 98.1 F (36.7 C) (Oral)   Resp 16   SpO2 98%  Physical Exam Alert and in no acute distress  ED Course / MDM  EKG:EKG Interpretation  Date/Time:  Tuesday Mar 15 2023 19:27:35 EDT Ventricular Rate:  76 PR Interval:    QRS Duration: 193 QT Interval:  556 QTC Calculation: 588 R Axis:   -81 Text Interpretation: intermittent paced rhythm no STEMI Confirmed by Meridee Score [phone]) on 03/15/2023 7:40:30 PM  I have reviewed the labs performed to date as well as medications administered while in observation.  Recent changes in the last 24 hours include none.  Plan  Current plan is for social work placement.    Bethann Berkshire, MD 03/16/23 [phone]

## 2023-03-16 NOTE — Plan of Care (Signed)
  Problem: Acute Rehab PT Goals(only PT should resolve) Goal: Pt Will Go Supine/Side To Sit Outcome: Progressing Flowsheets (Taken 03/16/2023 1434) Pt will go Supine/Side to Sit:  with minimal assist  with min guard assist Goal: Patient Will Transfer Sit To/From Stand Outcome: Progressing Flowsheets (Taken 03/16/2023 1434) Patient will transfer sit to/from stand: with minimal assist Goal: Pt Will Transfer Bed To Chair/Chair To Bed Outcome: Progressing Flowsheets (Taken 03/16/2023 1434) Pt will Transfer Bed to Chair/Chair to Bed: with min assist Goal: Pt Will Ambulate Outcome: Progressing Flowsheets (Taken 03/16/2023 1434) Pt will Ambulate:  25 feet  with minimal assist  with moderate assist  with rolling walker   2:34 PM, 03/16/23 Ocie Bob, MPT Physical Therapist with [facility] [phone] office [phone] mobile phone

## 2023-03-16 NOTE — ED Notes (Signed)
Pt assisted back into bed.

## 2023-03-17 LAB — URINE CULTURE: Culture: NO GROWTH

## 2023-03-17 NOTE — ED Notes (Addendum)
CSW spoke with pts son to explain that [facility] a bed for SNF. Pts son states that he is agreeable to accepting the bed offer at this time. Auth is pending at this time. TOC to follow.   Addendum 12pm: CSW updated that [facility] has been approved. CSW updated [facility] in admissions with [facility], they are ready to accept today. CSW provided RN with room and report numbers. CSW spoke to [employer] Services operational officer who will call for [facility] transport. CSW left HIPAA compliant VM for pts son to provide update. TOC signing off.

## 2023-03-17 NOTE — ED Provider Notes (Signed)
Emergency Medicine Observation Re-evaluation Note  Steven Krueger. is a 88 y.o. male, seen on rounds today.  Pt initially presented to the ED for complaints of Weakness and Dehydration Currently, the patient is awaiting social work placement.  Physical Exam  BP 128/76   Pulse 65   Temp 97.8 F (36.6 C) (Oral)   Resp (!) 26   SpO2 96%  Physical Exam Alert and in no acute distress  ED Course / MDM  EKG:EKG Interpretation  Date/Time:  Tuesday Mar 15 2023 19:27:35 EDT Ventricular Rate:  76 PR Interval:    QRS Duration: 193 QT Interval:  556 QTC Calculation: 588 R Axis:   -81 Text Interpretation: intermittent paced rhythm no STEMI Confirmed by Meridee Score [phone]) on 03/15/2023 7:40:30 PM  I have reviewed the labs performed to date as well as medications administered while in observation.  Recent changes in the last 24 hours include none.  Plan  Current plan is for social work placement.    Bethann Berkshire, MD 03/17/23 [phone]

## 2023-03-17 NOTE — ED Notes (Signed)
Pt lying comfortably (states no pain). Cleaned up spilled coffee off floor and from under bed. Pt ate all breakfast.

## 2023-03-17 NOTE — Discharge Instructions (Signed)
Go to [facility] now

## 2023-04-22 ENCOUNTER — Encounter: Payer: Self-pay | Admitting: Neurology

## 2023-04-22 ENCOUNTER — Ambulatory Visit: Payer: Medicare PPO | Admitting: Neurology

## 2023-04-22 VITALS — BP 106/65 | HR 90

## 2023-04-22 DIAGNOSIS — R220 Localized swelling, mass and lump, head: Secondary | ICD-10-CM | POA: Diagnosis not present

## 2023-04-22 NOTE — Patient Instructions (Signed)
Good to meet you. Schedule head CT with and without contrast for December 2024 or earlier if any change in symptoms. Follow-up after repeat head CT, call for any changes

## 2023-04-22 NOTE — Progress Notes (Signed)
NEUROLOGY CONSULTATION NOTE  Steven Bailey Sr. MRN: [id number] DOB: [birth year]  Referring provider: [name], PA-C Primary care provider: [name], PA-C  Reason for consult:  left occipital mass, syncope   Thank you for your kind referral of Steven POLEN Sr. for consultation of the above symptoms. Although his history is well known to you, please allow me to reiterate it for the purpose of our medical record. The patient was accompanied to the clinic by his son Steven Krueger who also provides collateral information. Records and images were personally reviewed where available.   HISTORY OF PRESENT ILLNESS: This is a very pleasant 88 year old right-handed man with a history of hypertension, hyperlipidemia, hypothyroidism, atrial fibrillation on Eliquis, NICM s/p PPM, CKD, presenting for evaluation of syncope and left occipital mass. He was previously living with his wife and other sone when he stopped eating. They brought him to [facility] where he was diagnosed with a UTI and started doing physical therapy. On 03/21/23, she was doing physical therapy when he had a syncopal episode and was admitted to [facility]. Echocardiogram showed an EF of 50%. CT abdomen and pelvis showed a large pleural effusion and abnormal finding of hepatic dome. He had a thoracentesis done. He was discharged to a SNF where he had another syncopal episode again during physical therapy and also while having a bowel movement. He was diagnosed with cystitis and treated with antibiotic. 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 There was moderate to severe right V4, right A2, and left P2 stenoses, moderate stenosis at the origin of the left vertebral artery, 4mm outpouching extending from the mid basilar artery suspicious for aneurysm.   He reports he is eating and sleeping okay, but his son notes he is not eating so well. He  denies any difficulty swallowing. He reports his right leg feels weaker, no numbness/tingling. His son denies any staring/unresponsive episodes, no jerking/twitching. He is mostly in the wheelchair because his BP drops when standing. He is doing BP but they note BP goes down when they sit him up on the side of the bed. He denies any headaches. He states he could not see all the time. Prior to initial hospitalization, he was managing his own medications. Since first admission, his memory changed. He would forget what he wanted to say. He had a normal birth and early development.  There is no history of febrile convulsions, CNS infections such as meningitis/encephalitis, significant traumatic brain injury, neurosurgical procedures, or family history of seizures.   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   Right bundle, left anterior hemiblock, and first degree AV block, June, 2015    Ventricular bigeminy 06/27/2014    PAST SURGICAL HISTORY: Past Surgical History:  Procedure Laterality Date   appendectomy     COLONOSCOPY WITH PROPOFOL N/A 04/09/2016   Procedure: COLONOSCOPY WITH PROPOFOL;  Surgeon: [name], MD;  Location: [facility] ENDOSCOPY;  Service:  Endoscopy;  Laterality: N/A;   PACEMAKER IMPLANT N/A 07/27/2019   Procedure: PACEMAKER IMPLANT;  Surgeon: Marinus Maw, MD;  Location: [facility] INVASIVE CV LAB;  Service: Cardiovascular;  Laterality: N/A;   TEMPORARY PACEMAKER N/A 07/26/2019   Procedure: TEMPORARY PACEMAKER;  Surgeon: Kathleene Hazel, MD;  Location: [facility] INVASIVE CV LAB;  Service: Cardiovascular;  Laterality: N/A;    MEDICATIONS: Current Outpatient Medications on File Prior to Visit  Medication Sig Dispense Refill   allopurinol (ZYLOPRIM) 300 MG tablet TAKE ONE TABLET BY MOUTH EVERY DAY (Patient taking differently: Take 300 mg by mouth daily.) 30 tablet 5   apixaban (ELIQUIS) 2.5 MG TABS tablet Take 2.5 mg by mouth 2 (two) times daily.     ciprofloxacin (CIPRO) 500 MG tablet Take 500 mg by mouth 2 (two) times daily.     fludrocortisone (FLORINEF) 0.1 MG tablet Take 0.1 mg by mouth daily.     levothyroxine (SYNTHROID) 50 MCG tablet Take 50 mcg by mouth daily.     metoprolol tartrate (LOPRESSOR) 25 MG tablet Take 25 mg by mouth 2 (two) times daily. Take half tab     rosuvastatin (CRESTOR) 5 MG tablet Take 1 tablet (5 mg total) by mouth 3 (three) times a week. 12 tablet 1   No current facility-administered medications on file prior to visit.    ALLERGIES: No Known Allergies  FAMILY HISTORY: Family History  Problem Relation Age of Onset   CAD Mother    Cancer Father    Heart disease Other    Hypertension Other    Cancer Other     SOCIAL HISTORY: Social History   Socioeconomic History   Marital status: Married    Spouse name: Not on file   Number of children: 2   Years of education: Not on file   Highest education level: Not on file  Occupational History   Occupation: retired  Tobacco Use   Smoking status: Former   Smokeless tobacco: Current    Types: Associate Professor Use: Never used  Substance and Sexual Activity   Alcohol use: No   Drug use: No   Sexual activity: Not on file  Other Topics Concern   Not on file  Social History Narrative   Not on file   Social Determinants of Health   Financial Resource Strain: Not on file  Food Insecurity: Not on file  Transportation Needs: Not on file  Physical Activity: Not on file  Stress: Not on file  Social Connections: Not on  file  Intimate Partner Violence: Not on file     PHYSICAL EXAM: Vitals:   04/22/23 1321  BP: 106/65  Pulse: 90  SpO2: 96%   General: No acute distress Head:  Normocephalic/atraumatic Skin/Extremities: No rash, no edema Neurological Exam: Mental status: alert and awake, no dysarthria or aphasia, Fund of knowledge is appropriate. Attention and concentration are normal.   Cranial nerves: CN I: not tested CN II: pupils equal, round, visual fields intact CN III, IV, VI:  full range of motion, no nystagmus, no ptosis CN V: facial sensation intact CN VII: upper and lower face symmetric CN VIII: hearing intact to conversation Bulk & Tone: normal, no fasciculations. Motor: 5/5 throughout with no pronator drift. Sensation: intact to light touch, cold, pin, vibration sense.  No extinction to double simultaneous stimulation. Deep Tendon Reflexes: +2 throughout Cerebellar: no incoordination on finger to nose testing Gait: not tested Tremor: none   IMPRESSION: This is a very  pleasant 88 year old right-handed man with a history of hypertension, hyperlipidemia, hypothyroidism, atrial fibrillation on Eliquis, NICM s/p PPM, CKD, presenting for evaluation of syncope and left occipital mass. He has orthostatic hypotension and has had syncopal episodes due to this. As part of his evaluation for syncope, he had brain imaging which showed a left occipital mass. Neurological exam non-focal. We had an extensive discussion regarding goals of care, monitor clinically, we will repeat head CT with and without contrast in December 2024 to assess stability of lesion, follow-up after imaging. They know to call for any changes.     Thank you for allowing me to participate in the care of this patient. Please do not hesitate to call for any questions or concerns.   Patrcia Dolly, M.D.  CC: [name], PA-C

## 2023-05-09 ENCOUNTER — Ambulatory Visit (INDEPENDENT_AMBULATORY_CARE_PROVIDER_SITE_OTHER): Payer: Medicare PPO

## 2023-05-09 DIAGNOSIS — I442 Atrioventricular block, complete: Secondary | ICD-10-CM

## 2023-05-10 LAB — CUP PACEART REMOTE DEVICE CHECK
Battery Remaining Longevity: 60 mo
Battery Remaining Percentage: 61 %
Battery Voltage: 3.02 V
Brady Statistic AP VP Percent: 12 %
Brady Statistic AP VS Percent: 1 %
Brady Statistic AS VP Percent: 83 %
Brady Statistic AS VS Percent: 2.2 %
Brady Statistic RA Percent Paced: 8.7 %
Brady Statistic RV Percent Paced: 95 %
Date Time Interrogation Session: 20240722173532
Implantable Lead Connection Status: 753985
Implantable Lead Connection Status: 753985
Implantable Lead Implant Date: 20201009
Implantable Lead Implant Date: 20201009
Implantable Lead Location: 753859
Implantable Lead Location: 753860
Implantable Pulse Generator Implant Date: 20201009
Lead Channel Impedance Value: 390 Ohm
Lead Channel Impedance Value: 400 Ohm
Lead Channel Pacing Threshold Amplitude: 0.5 V
Lead Channel Pacing Threshold Amplitude: 0.75 V
Lead Channel Pacing Threshold Pulse Width: 0.5 ms
Lead Channel Pacing Threshold Pulse Width: 0.5 ms
Lead Channel Sensing Intrinsic Amplitude: 1.1 mV
Lead Channel Sensing Intrinsic Amplitude: 11.3 mV
Lead Channel Setting Pacing Amplitude: 2 V
Lead Channel Setting Pacing Amplitude: 2.5 V
Lead Channel Setting Pacing Pulse Width: 0.5 ms
Lead Channel Setting Sensing Sensitivity: 4 mV
Pulse Gen Model: 2272
Pulse Gen Serial Number: 9166894

## 2023-05-19 NOTE — Progress Notes (Signed)
Remote pacemaker transmission.   

## 2023-05-19 DEATH — deceased
